# Patient Record
Sex: Male | Born: 2000 | Race: White | Marital: Single | Smoking: Never smoker
Health system: Northeastern US, Academic
[De-identification: ages and names within clinical notes are randomized; demographics above are authoritative.]

## PROBLEM LIST (undated history)

## (undated) DIAGNOSIS — Z9109 Other allergy status, other than to drugs and biological substances: Secondary | ICD-10-CM

## (undated) DIAGNOSIS — J302 Other seasonal allergic rhinitis: Secondary | ICD-10-CM

## (undated) DIAGNOSIS — I10 Essential (primary) hypertension: Secondary | ICD-10-CM

## (undated) DIAGNOSIS — F988 Other specified behavioral and emotional disorders with onset usually occurring in childhood and adolescence: Secondary | ICD-10-CM

## (undated) HISTORY — DX: Essential (primary) hypertension: I10

## (undated) HISTORY — DX: Other allergy status, other than to drugs and biological substances: Z91.09

---
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
## 2021-03-07 NOTE — Telephone Encounter (Signed)
Spoke with mom patient is scheduled on 03/06 at 10:00 with Dr. Hermelinda Medicus  Mom stated patient goes to college in [place] and will not be home until then

## 2021-03-27 ENCOUNTER — Ambulatory Visit: Payer: Commercial Managed Care - PPO | Attending: Sleep Medicine | Admitting: Sleep Medicine

## 2021-03-27 DIAGNOSIS — G4733 Obstructive sleep apnea (adult) (pediatric): Secondary | ICD-10-CM

## 2021-03-27 NOTE — Progress Notes (Signed)
[facility]: Video Visit New Patient Encounter       Referred to the [facility] by Doristine Locks, NP for a new patient evaluation.    Consent was previously obtained from the patient to complete this video telemedicine consultation, including the potential for financial liability.    Location of Patient: Home/[facility]  Location of Telemedicine Provider: Provider Office  Other participants in telemedicine encounter and roles: n/a    HISTORY OF THE PRESENT ILLNESS (HPI)       [name] is a 20 y.o. young man with a medical history significant for hypertension, attention deficit disorder (ADD), anxiety, and nephrolithiasis. Ronald Wise was referred to our service for evaluation for possible sleep disordered breathing. His presenting symptoms include difficulty initiating and maintaining sleep, snoring, witnessed pauses in breathing during sleep, bruxism, restless sleep, unrefreshing/nonrestorative sleep, morning xerostomia, morning sore throat, daytime sleepiness, fatigue, difficulty with concentration/focus and irritability.     SLEEP HISTORY:  Do you keep a regular sleep schedule?: (P) No  What time do you usually go to bed (please note am/pm)?  : (P) Between 11pm and 1am  How long does it take you to fall asleep?: (P) Around an hour and a half to two hours naturally and arond 30 minutes to an hour with melatonin.  Once asleep, how many times do you wake up? : (P) 3-4 times  What causes you to wake up?: (P) Can be a dreaming, temperature in room, uncomfortable, etc.  How long does it take you to fall back asleep?: (P) It depends on why I woke up, it can be a few seconds to even like 5-10 minutes. It varies.  What position(s) do you sleep in?: (P) Usually on my side or my stomach.  What time do you usually get up?: (P) Monday, Wednesday, Friday I wake up at 9am, Tuesday and Thursday 5:35am *for swim team practice  Do you wake up feeling rested?: (P) No  Do you feel tired  even after getting a full night's sleep?: (P) Yes  Do you nap?: (P) No  Are your naps refreshing?  : (P) Yes  Do you consume caffeine?: (P) Yes  What type(s) of caffeine do you consume (coffee, soda, pills, chocolate, other)?: (P) Soda, pre workout before lifting, or energy drink.  How many drinks/pills/servings per day?: (P) Usually 1-2 sodas a day, energy drinks are rare, and pre workout is around 2-3 times a week.   Do you consume for alertness, habit, or another reason?: (P) I consume these in order to wake me up in the morning if needed, or for alertness.   Snoring: (P) Yes  Witnessed gasping/choking/pauses in breathing: (P) Yes *observed by his sister during a recent family trip where they shared a hotel room, she told their mom who encouraged Ronald Wise to pursue a sleep study, also given his elevated blood pressure  Waking up gasping for air or feeling like you are choking: (P) No  Frequent nighttime urination: (P) No  Night sweats/hot flashes: (P) No  Grinding your teeth at night: (P) Yes  Nasal congestion/drainage: (P) Yes  Restlessness in legs before bed: (P) No  Frequent leg movements during sleep: (P) Yes  Morning dry mouth/sore throat: (P) Yes  Morning headache/head pain: (P) No  Daytime sleepiness: (P) Yes  Dozing while driving/motor vehicle accident related to dozing while driving: (P) No  Trouble with focus/concentration/attention: (P) No  Irritability: (P) Yes  Depression: (P) Yes  Anxiety: (P)  Yes   Unusual behaviors during sleep (choose all that apply)Unusual behaviors during sleep (choose all that apply): (P) Sleep talking       EPWORTH SLEEPINESS SCALE QUESTIONNAIRE     Recent Review Flowsheet Data     Epworth Sleep Scale 03/26/2021    Sitting and reading 1    Watching TV 1    Sitting inactive in a public place (e.g a theater or a meeting) 1    As a passenger in a car for an hour without a break 2    Lying down to rest in the afternoon when circumstances permit 2    Sitting and talking to someone  1    Sitting quietly after a lunch without alcohol 0    In a car, while stopped for a few minutes in traffic 0    EPWORTH TOTAL  8      Ronald Wise completed an Epworth Sleepiness Scale, with a score of 8/24, which is not suggestive of significant daytime sleepiness. He has at times experienced drowsiness while driving long distances, and knows to pull over immediately if this situation occurs. We discussed safe driving precautions. He does not generally take naps during the daytime hours. He takes lisdexamfetamine 40 mg daily for ADD, and occasionally methylphenidate IR 20 mg prn. He is on the swim team at school.        ACTIVE PROBLEM LIST     Patient Active Problem List   Diagnosis Code    Anxiety F41.9    ADHD (attention deficit hyperactivity disorder) F90.9    Distal radius fracture S52.509A    Stone in kidney N20.0    History of kidney stones Z87.442    Elevated blood pressure reading R03.0       PAST MEDICAL HISTORY     Past Medical History:   Diagnosis Date    ADD (attention deficit disorder)     Seasonal allergies        REVIEW OF SYSTEMS       Weight gain/lbs: (P) 0  Weight loss/lbs: (P) 20  Palpitations: (P) No  Swelling of feet/ankles: (P) No  Cough: (P) Yes  Wheeze: (P) No  Shortness of breath: (P) No  Heartburn/GERD: (P) No  Constipation: (P) No  Seizure: (P) No  Pain: (P) No   Pain interferes with sleep: (P) No       FAMILY HISTORY    Insomnia: (P) No  Narcolepsy: (P) No  Restless Leg Syndrome: (P) No  Sleep Apnea: (P) Yes  Relationship: (P) Uncle *Dad is a very loud snorer, paternal grandmother; snorers on mom's side as well      SOCIAL HISTORY     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  MEDICATIONS       Current Outpatient Medications   Medication Sig    methylphenidate (RITALIN) 20 mg tablet Take 20 mg by mouth daily    potassium citrate (UROCIT-K) 10 mEq (1080 mg) CR tablet Take 1 tablet (10 mEq total) by mouth daily  for Hypocitraturic Calcium Oxalate Kidney Stones    chlorthalidone (HYGROTEN) 25 mg tablet TAKE 1/2 TABLET(12.5 MG) BY MOUTH EVERY MORNING    lisdexamfetamine (VYVANSE) 40 MG capsule Take 40 mg by mouth every morning    fluticasone (FLONASE) 50 MCG/ACT nasal spray Spray 1 spray into nostril daily    EPINEPHrine 0.3 mg/0.3 mL auto-injector Inject 0.3 mLs (0.3 mg total) into the muscle as needed for Anaphylaxis    cetirizine (ZYRTEC) 10 MG tablet Take 10 mg by mouth daily     No current facility-administered medications for this visit.       VITALS   There were no vitals taken for this visit.  Height: 5'10"  Most recent recorded weight: 214 lbs, BMI ~30    PHYSICAL EXAM     Limited physical examination due to video visit.     Constitutional/General: Patient seated in chair, no acute distress noted   Eyes: Clear, no conjunctivitis noted  HEENT: No nasal discharge; modified Mallampati class II airway  Cardiovascular: Patient reported pulses in bilateral upper extremities  Respiratory: Non-labored breathing, no coughing observed  Gastrointestinal No visible abdominal distention    Musculoskeletal: Gait  Neurologic: Alert, attentive, and fully oriented.  Speech was full and fluent.  No aphasia.  Full eye movements.  Normal facial symmetry.  Tongue protrudes midline  Integumentary: No visible rashes    ASSESSMENT      Concern for Obstructive Sleep Apnea (OSA)     Seaborn Nakama is a 20 y.o. young man with a medical  history significant for hypertension, attention deficit disorder (ADD), anxiety, and nephrolithiasis. Ronald Wise was referred to our service for evaluation for possible sleep disordered breathing. His presenting symptoms include difficulty initiating and maintaining sleep, snoring, witnessed pauses in breathing during sleep by his sibling, bruxism, restless sleep, unrefreshing/nonrestorative sleep, morning xerostomia, morning sore throat, daytime sleepiness, fatigue, difficulty with concentration/focus and irritability. There is a strong family history of loud snoring as well as obstructive sleep apnea (OSA). In sum, Ronald Wise's clinical presentation raises concern for obstructive sleep apnea, particularly as OSA is a recognized risk factor for secondary hypertension.       RECOMMENDATIONS/PLAN       We talked about obstructive sleep apnea and its associated symptoms. We discussed the potential consequences of untreated OSA, depending on the severity of the sleep disordered breathing. We talked about safe driving. We also talked about how sleep apnea is diagnosed, and discussed common treatments for OSA.     Within the next couple of months, Ronald Wise will undergo a home sleep apnea test, and I will contact him via MyChart upon completion of the study to review the results and discuss treatment options, as appropriate.     Consent was previously obtained from the patient to complete this video telemedicine consultation, including the potential for financial liability. The plan was discussed with the patient and he demonstrated understanding to the provider's satisfaction. In my professional opinion, my resulting orders would not have differed if an in-person consultation had been performed.     This was a moderate complexity medical decision making encounter.    Nadara Eaton, MD  Associate Professor of Clinical Medicine  [facility]  Electronically signed by Nadara Eaton, MD on 03/27/2021 at 1:29 PM.

## 2021-04-21 NOTE — Progress Notes (Signed)
[facility]       HOME SLEEP STUDY POST TEST QUESTIONNAIRE      Patient Name:        Ronald Wise    MRN #  [id number]      1. What was your approximate bedtime? _________________________      2.  How long did it take you to fall asleep? _________________________      3. How many times did you wake up? _________________________      a. Approximately what time? / How long were you awake?    _____________/_______________    _____________/_______________    _____________/_______________    _____________/_______________    4. At what time did you get up for the day? __________________________    5. Any problems or comments?  __________________________               Patient signature:______________________   Date:_______________            Please Complete this page  &  Return with Sleep Study Case.Marland KitchenMarland KitchenThank You!                                                                                                      [facility]    HOME SLEEP TESTING EQUIPMENT AGREEMENT FORM    PATIENT NAME:  Ronald Wise     MRN #:    [id number]           PHONE NUMBER:   [phone]     ADDRESS:   [address]  [facility] [address]    I have received a Home Sleep Monitor (Alice NightOne) from the [facility] and have been instructed how to use it.    Serial Number:  [id number]    I have agreed to return this monitor to [facility]. on  ______________ between 7:30 AM and 3:00 PM.     Anyone can drop-off HSAT device for a patient   Return device to 3rd floor office If any drop-off changes call @ [phone]   To ensure patient health, please maintain social distancing rules during drop off.     Sleep Lab open until 4:30 pm Monday thru Friday    If you have any equipment questions during testing, please call our main lab at [phone].    This monitor is the property of [facility].  The value of this equipment is $2,495.  If you fail to return it by the date stated above, I understand  that the Hospitals legal authorities will be notified.  Failure to return this monitor is considered theft of hospital property,                                            READ THIS FIRST    Home Sleep Study Guide     For Home Sleep Study set-up instructions watch the following video: [email or web address]  Click the  link sent via a MyChart message or search the Internet for [employer].  Refer to the provided instructions as needed.     To initiate recording simply place the blue belt around your chest making sure both ends of the belt are fully connected to the device.  This belt action automatically starts and stops the apnea screening device. The On/Off quadrant light spins for 30 seconds.  The 3 sensor lights flash yellow until they recognize a good signal changing to solid green.  Once all 3 turn green the On/Off light also turns solid green for 1 minute, then they'll shut off for the night one by one.  The device does not need to be on your skin, can go over top of nightwear.     We suggest that you adequately tape the cannula and oximeter, and sleep in whatever position you prefer.  If you get up during the night you can leave the equipment on, feel free to remove the finger probe briefly while up, just put back on a finger when returning to bed.       In the morning, unplug one side of the belt to stop the study and discard nasal cannula before placing equipment back in the box.  All other equipment can remain attached to the unit.       Return the unit to the lab the next business day between preferred hours of 7:30 am and 3:00 pm.      If unable to return the device timely please call us @ [phone]     Results will be communicated via telemedicine or My Chart.      If you or someone in your home develop COVID-19 symptoms within 24 hours of the study please call the lab at [phone].                                                           [facility]    HOME SLEEP TESTING EQUIPMENT AGREEMENT FORM    PATIENT NAME:  Ronald Wise     MRN #:    [id number]           PHONE NUMBER:   [phone]     ADDRESS:   [address]  [facility] [address]    I have received a Home Sleep Monitor (Alice NightOne) from the [facility] and have been instructed how to use it.    Serial Number:  [id number]    I have agreed to return this monitor to [facility] on  ______________ between 7:30 AM and 3:00 PM.     Anyone can drop-off HSAT device for a patient   Return device to 3rd floor office If any drop-off changes call @ [phone]   To ensure patient health, please maintain social distancing rules during drop off.     Sleep Lab open until 4:30 pm Monday thru Friday    If you have any equipment questions during testing, please call our main lab at [phone].    This monitor is the property of [facility].  The value of this equipment is $2,495.  If you fail to return it by the date stated above, I understand that the Hospitals legal authorities will be notified.  Failure to return this monitor is considered theft of hospital property.      Signed:  ________________________   Date: _________      Witness:  _______________________   Date: _________

## 2021-04-23 ENCOUNTER — Ambulatory Visit: Payer: Commercial Managed Care - PPO | Attending: Sleep Medicine | Admitting: Sleep Medicine

## 2021-04-23 ENCOUNTER — Other Ambulatory Visit: Payer: Self-pay

## 2021-04-23 DIAGNOSIS — G4733 Obstructive sleep apnea (adult) (pediatric): Secondary | ICD-10-CM | POA: Insufficient documentation

## 2021-04-23 DIAGNOSIS — R0683 Snoring: Secondary | ICD-10-CM | POA: Insufficient documentation

## 2021-04-23 NOTE — Progress Notes (Signed)
ARRIVED FOR HSAT PICK UP    Sleep MD:  CZM    Interpreter Used: No    Estimated return date:  04/24/21     Ronald Wise on 04/23/2021 at 8:32 AM

## 2021-04-25 NOTE — Progress Notes (Signed)
RETURNED HSAT DEVICE    Data quality: adequate, analysis in process    Follow up date: Visit date not found     Ronald Wise on 04/25/2021 at 1:33 PM

## 2021-04-26 NOTE — Procedures (Addendum)
Home Sleep Apnea Test Report    This is a report for a nocturnal, unattended home sleep apnea test completed by the [facility] on April 23, 2021.    This study utilized a 6-channel Type III home sleep respiratory recorder.  The following parameters were monitored for a duration of 553.5 minutes: snoring (high frequency vibrations in airflow), oronasal pressure airflow, RIP chest effort, SpO2, pulse rate, and body position.  There was no EEG monitoring during this study, as such we were not able to discern between periods of sleep and wake. As such, home sleep tests tend to underestimate the actual severity of sleep disordered breathing. The data obtained during this home sleep apnea test was generally of good quality.    This is a normal home sleep apnea test. The study is not suggestive of a diagnosis of obstructive sleep apnea (OSA). The patient's unattended apnea hypopnea index (uAHI) was 1.3 respiratory events per hour of recording time. His oxyhemoglobin saturation was normal throughout the night.  Soft snoring was noted occasionally.     Of note, the above table labeled, "Heart Rate Stats," contains spurious data. The patient's heart rate generally ranged in the 50s-80s bpm. He did not have bradycardia or tachycardia as suggested in the table above (incorrect/spurious data).    This study was electronically signed by Nadara Eaton, MD, interpreting physician, on 04/29/2021 at 8:15 AM.

## 2021-06-22 ENCOUNTER — Other Ambulatory Visit: Payer: Self-pay | Admitting: Pediatric Nephrology

## 2021-06-22 DIAGNOSIS — N200 Calculus of kidney: Secondary | ICD-10-CM

## 2021-06-23 MED ORDER — POTASSIUM CITRATE 1080 MG PO TBCR *I*
10.0000 meq | ORAL_TABLET | Freq: Every day | ORAL | 2 refills | Status: DC
Start: 2021-06-23 — End: 2021-10-07

## 2021-06-23 NOTE — Telephone Encounter (Signed)
Attempted to call pt to verify pharmacy.   No answer, left detailed message on identified voicemail requesting call back to confirm.

## 2021-06-30 ENCOUNTER — Ambulatory Visit: Payer: Commercial Managed Care - PPO | Admitting: Pediatric Nephrology

## 2021-09-01 ENCOUNTER — Other Ambulatory Visit: Payer: Self-pay

## 2021-09-01 ENCOUNTER — Encounter: Payer: Self-pay | Admitting: Pediatric Nephrology

## 2021-09-01 ENCOUNTER — Ambulatory Visit: Payer: Commercial Managed Care - PPO | Attending: Pediatric Nephrology | Admitting: Pediatric Nephrology

## 2021-09-01 VITALS — BP 150/100 | HR 106 | Temp 98.1°F | Ht 70.7 in | Wt 198.1 lb

## 2021-09-01 DIAGNOSIS — Z1389 Encounter for screening for other disorder: Secondary | ICD-10-CM | POA: Insufficient documentation

## 2021-09-01 DIAGNOSIS — N200 Calculus of kidney: Secondary | ICD-10-CM | POA: Insufficient documentation

## 2021-09-01 DIAGNOSIS — R030 Elevated blood-pressure reading, without diagnosis of hypertension: Secondary | ICD-10-CM | POA: Insufficient documentation

## 2021-09-01 DIAGNOSIS — Z87442 Personal history of urinary calculi: Secondary | ICD-10-CM | POA: Insufficient documentation

## 2021-09-01 LAB — POCT URINE MICROSCOPY

## 2021-09-01 LAB — POCT URINALYSIS DIPSTICK
Glucose,UA POCT: NORMAL mg/dL
Ketones,UA POCT: NEGATIVE mg/dL
Leuk Esterase,UA POCT: NEGATIVE
Lot #: 64120401
Nitrite,UA POCT: NEGATIVE
PH,UA POCT: 5 (ref 5–8)

## 2021-09-01 LAB — CALCIUM, URINE: Calcium,Ur: 9.1 mg/dL

## 2021-09-01 LAB — CALCIUM,UR + CREATININE,UR RATIO: Ca/creatinine ratio,Ur: 0.09

## 2021-09-01 LAB — CREATININE, URINE: Creatinine,UR: 107 mg/dL (ref 20–300)

## 2021-09-01 LAB — POCT REFRACTOMETER SPECIFIC GRAVITY: Specific gravity: 1.011 (ref 1.002–1.030)

## 2021-09-01 NOTE — Progress Notes (Unsigned)
Subjective:      Chief Complaint   Patient presents with   . Nephrolithiasis     Hx of passing 3 stones   . Hypertension     Consistent elevated bps at clinic       History of Present Illness:Ronald Wise is a 21 y.o. male who is seen for follow up for history of passing 3 kidney stones, most recently in Jun 2021; he also has had elevated blood pressures.Ronald Wise is here today without a parent.  BP ran high today  BP during the school year was relatively NL, but he does not remember what they were. One BP from Sep 2022 was 139/77.  He did not do much physical activity due to studying so BP increased later in the school year.  He is now more active and believes his BP should improve.  Unfortunately no BPs were entered in flowsheet since 2021.  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  He may have passed a kidney stone 2 weeks ago after playing frisbee golf; he saw blood in the urine and this cleared after taking fluids; he did not feel the passage of any kidney stone.  He was not drinking fluids much before but in college is keeping up with his drinking (60 oz per day).  There was no dysuria.  He denies swelling of face of legs.  He was not taking his chlorthalidone during much of the school year.  He had his sleep study which was negative for sleep apnea.    During the ABPM in Jun 2022 Ronald Wise woke up to find the monitor had migrated down his arm; he replaced it. Waking up may have raised his BP.      Patient's medications, allergies, past medical, surgical, social and family histories were reviewed and updated as needed.     Problem List:  Patient Active Problem List    Diagnosis Date Noted   . Elevated blood pressure reading [R03.0] 09/05/2020   . History of kidney stones [Z87.442] 09/01/2018   . Stone in kidney [N20.0] 03/02/2018   . Distal radius fracture [S52.509A] 12/28/2014   . Anxiety [F41.9] 02/17/2013   . ADHD (attention deficit hyperactivity disorder) [F90.9] 02/17/2013        Allergies:  Ketorolac, Environmental allergies, Penicillins, No known latex allergy, and Meningococcal group b vaccine    Current Medications:    Current Outpatient Medications:   .  potassium citrate (UROCIT-K) 10 mEq (1080 mg) CR tablet, Take 1 tablet (10 mEq total) by mouth daily  for Hypocitraturic Calcium Oxalate Kidney Stones, Disp: 90 tablet, Rfl: 2  .  methylphenidate (RITALIN) 20 mg tablet, Take 20 mg by mouth daily, Disp: , Rfl:   .  chlorthalidone (HYGROTEN) 25 mg tablet, TAKE 1/2 TABLET(12.5 MG) BY MOUTH EVERY MORNING, Disp: 45 tablet, Rfl: 2  .  lisdexamfetamine (VYVANSE) 40 MG capsule, Take 40 mg by mouth every morning, Disp: , Rfl:   .  cetirizine (ZYRTEC) 10 MG tablet, Take 10 mg by mouth daily, Disp: , Rfl:     Review of Systems  Review of Systems   Constitutional: Negative for fever.   HENT: Negative for congestion, facial swelling, nosebleeds, rhinorrhea and sore throat.    Eyes: Negative for visual disturbance.   Respiratory: Negative for cough and shortness of breath.    Cardiovascular: Negative for chest pain and leg swelling.   Gastrointestinal: Negative for abdominal pain, constipation, diarrhea, nausea and vomiting.   Genitourinary: Negative  for dysuria, flank pain, frequency and hematuria.   Musculoskeletal: Negative for arthralgias and joint swelling.   Skin: Negative for rash.   Neurological: Negative for headaches.   Hematological: Does not bruise/bleed easily.   Psychiatric/Behavioral: Negative for behavioral problems, dysphoric mood and sleep disturbance.           Objective:    Vitals:   Vitals:    09/01/21 0925 09/01/21 1057   BP: (!) 155/96 (!) 150/100   BP Location:  Right arm   Patient Position:  Sitting   Cuff Size:  adult   Pulse: 106    Temp: 36.7 C (98.1 F)    TempSrc: Temporal    Weight: 89.9 kg (198 lb 1.6 oz)    Height: 1.796 m (5' 10.7")      65 %ile (Z= 0.39) based on CDC (Boys, 2-20 Years) Stature-for-age data based on Stature recorded on 09/01/2021. 91 %ile (Z=  1.37) based on CDC (Boys, 2-20 Years) weight-for-age data using vitals from 09/01/2021.  Body mass index is 27.86 kg/m. 89 %ile (Z= 1.24) based on CDC (Boys, 2-20 Years) BMI-for-age based on BMI available as of 09/01/2021.        11/18/2020  1003 11/18/2020  1004 11/18/2020  1038 09/01/2021  0925    BP: 148/94 150/89 140/88 155/96    BP Location: -- -- Right arm --        Blood pressure %iles are not available for patients who are 18 years or older.     Physical Exam:  Physical Exam  Vitals reviewed.   Constitutional:       General: He is not in acute distress.     Appearance: Normal appearance. He is well-developed.   HENT:      Head: Normocephalic and atraumatic.      Right Ear: External ear normal.      Left Ear: External ear normal.      Nose: Nose normal.      Mouth/Throat:      Mouth: Mucous membranes are moist.      Pharynx: No posterior oropharyngeal erythema.      Comments: Tonsils not enlarged  Eyes:      General: No scleral icterus.        Right eye: No discharge.         Left eye: No discharge.      Conjunctiva/sclera: Conjunctivae normal.   Neck:      Thyroid: No thyromegaly.   Cardiovascular:      Rate and Rhythm: Normal rate and regular rhythm.      Pulses: Normal pulses.      Heart sounds: Normal heart sounds. No murmur heard.     No friction rub. No gallop.   Pulmonary:      Effort: Pulmonary effort is normal. No respiratory distress.      Breath sounds: Normal breath sounds. No wheezing or rales.   Abdominal:      General: Abdomen is flat. There is no distension.      Palpations: Abdomen is soft.      Tenderness: There is no abdominal tenderness. There is no right CVA tenderness, left CVA tenderness or guarding.   Genitourinary:     Comments: Bladder non-tender and normal to palpation  Musculoskeletal:         General: No tenderness.      Right lower leg: No edema.      Left lower leg: No edema.   Lymphadenopathy:  Cervical: No cervical adenopathy.   Skin:     General: Skin is warm and dry.       Coloration: Skin is not pale.      Findings: No rash.   Neurological:      General: No focal deficit present.      Mental Status: He is alert and oriented to person, place, and time.   Psychiatric:         Mood and Affect: Mood normal.         Behavior: Behavior normal.         Laboratory:   MICROSCOPIC URINALYSIS:   (Performed by Provider)    LATEST LABORATORY RESULTS:    Recent Results (from the past 72 hour(s))   POCT urine microscopy    Collection Time: 09/01/21  9:30 AM   Result Value Ref Range    Squamous epithelial cells, UA POCT      Transitional epithelial cells, UA POCT 2-4 0 - 1 /hpf    Renal epithelial cells, UA POCT      Mucous, UA POCT      RBC, UA POCT 11-50 0 - 2 /hpf    WBC, UA POCT 0-5 0 - 5 /hpf    WBC clumps, UA POCT 0-1 None /hpf    Oval fat bodies, UA POCT      Yeast, UA POCT      Bacteria, UA POCT 0-5 None /hpf    Trichomonas, UA POCT      Crystals, UA POCT Present Not Present    Crystals comment/type Ca oxalate 2-10/hpf     Casts, UA POCT      Cast comment/type none    POCT urinalysis dipstick    Collection Time: 09/01/21  9:36 AM   Result Value Ref Range    Specific gravity,UA POCT Test Not Performed 1.002 - 1.030    PH,UA POCT 5.0 5 - 8    Leuk Esterase,UA POCT Negative Negative    Nitrite,UA POCT Negative Negative    Protein,UA POCT Trace (!) Negative mg/dL    Glucose,UA POCT Normal Normal mg/dL    Ketones,UA POCT Negative Negative mg/dL    Urobilinogen,UA      Bilirubin,Ur      Blood,UA POCT About 250 (!) Negative    Exp date 05/27/2022     Lot # [id number]    POCT refractometer specific gravity    Collection Time: 09/01/21  9:38 AM   Result Value Ref Range    Specific gravity 1.011 1.002 - 1.030      Latest Reference Range & Units 09/01/21 10:44 09/01/21 18:00   Collection Period hrs RANDOM    Total Volume,UR mL RANDOM    Calcium,Ur mg/dL 9.1    Creatinine,UR 20 - 300 mg/dL 540    Ca/creatinine ratio,Ur   0.09           Assessment:    Ronald Wise is a 21 y.o. who was seen in follow up of at  least 3kidney stones with a strong family history of kidney stones. His most recent stone was likely passed in the Spring of 2023 at college; he did not take his chlorthalidone much during the school year..  Urine Ca/Cr ais NL at 0.09; citrate is pending.  BPshave been high,in the 130s-140s systolic and 80s-90s diastolic, and even higher in clinic today. I am asking for some home BPs to document whether this is a consistent finding or more of a white coat phenomenon. Marland KitchenMarland Kitchen  Plan:      Urine sent for Ca, Cr, citrate, and oxalate.   Continue to drink 60 oz fluids per day.  Check BP twice a day for next two weeks; log in to flowsheet on Mychart  Please record BPs in the flowsheet I sent you through mychart. To do this just follow these instructions:    Log in to MyChart.   Put the cursor over one of the tabs on the site, titled "My Medical Record"   A tab shows up directly below it and on the right side, under "medical tools" click on "Track My Health"   It leads to another page   Below where it shows "Active Flowsheets" , Click on the Blood Pressure Flowsheet   Then it leads to a page where data can be added.     To add the Data :   Click "Add New Data"   Add data and click continue   A review page will show up, and click "submit"   Finally, it leads back to a page where the data submitted are shown    Look for Internal Medicine or Family Medicine PCP  Refer to Adult Nephrolgy-?R Shawnee Knapp  Thank you for the opportunity to share in [place] Reindel's care. We have asked that Sing Hise return to the pediatric nephrology clinic within 6 weeks. In the interval, please do not hesitate to call with new developments or if you have additional questions or concerns.     Plan of care, including education on the safe and effective use of medication(s) and/or medical equipment if prescribed, was discussed with the patient/family. Patient/family verbalized understanding and agreed with the treatment options discussed.       Vida Roller, MD

## 2021-09-01 NOTE — Patient Instructions (Signed)
Urine sent for Ca, Cr, citrate, and oxalate.   Check BP twice a day for next two weeks; log in to flowsheet on Mychart  Please record BPs in the flowsheet I sent you through mychart. To do this just follow these instructions:    Log in to MyChart.   Put the cursor over one of the tabs on the site, titled "My Medical Record"   A tab shows up directly below it and on the right side, under "medical tools" click on "Track My Health"   It leads to another page   Below where it shows "Active Flowsheets" , Click on the Blood Pressure Flowsheet   Then it leads to a page where data can be added.     To add the Data :   Click "Add New Data"   Add data and click continue   A review page will show up, and click "submit"   Finally, it leads back to a page where the data submitted are shown    Look for Internal Medicine or Family Medicine PCP  Refer to Adult Nephrolgy-?R Shawnee Knapp  Thank you for the opportunity to share in [place]'s care. We have asked that Fernando Poole return to the pediatric nephrology clinic within 6 weeks. In the interval, please do not hesitate to call with new developments or if you have additional questions or concerns.

## 2021-09-02 NOTE — Progress Notes (Signed)
Spoke with mom schedule appt for 10/27/21 at 8:30am

## 2021-09-08 LAB — OXALATE, URINE
Creat,Ur: 110 mg/dL
Oxalates, Ur: 21 mg/L

## 2021-09-08 LAB — CITRATE, URINE
Citric Acid, Ur: 124 mg/L
Citric/Creat: 113 mg/g — ABNORMAL LOW (ref >=150)

## 2021-10-07 ENCOUNTER — Other Ambulatory Visit: Payer: Self-pay | Admitting: Pediatric Nephrology

## 2021-10-07 ENCOUNTER — Encounter: Payer: Self-pay | Admitting: Pediatric Nephrology

## 2021-10-07 DIAGNOSIS — N200 Calculus of kidney: Secondary | ICD-10-CM

## 2021-10-07 DIAGNOSIS — Z87442 Personal history of urinary calculi: Secondary | ICD-10-CM

## 2021-10-07 NOTE — Telephone Encounter (Addendum)
Paged Dr. Hermelinda Medicus to see if stone should be dropped at the lab/confirm order needed.  ---------------------------------------------  (ADDENDUM) Returned missed call from Dr. Hermelinda Medicus 10/08/19 1204:    - Provider will order stone analysis and pt can drop stone off.     Would like to have answers to the following:  1. How much is he drinking?  2. Taking K-Citrate still? Missed doses? What dose is he taking?  3. Taking Chlorthalidone still? Missed doses? What dose is he taking?    Writer sent MyChart message with the above.

## 2021-10-08 ENCOUNTER — Other Ambulatory Visit
Admission: RE | Admit: 2021-10-08 | Discharge: 2021-10-08 | Disposition: A | Discharge status: Home / Self Care | Payer: Commercial Managed Care - PPO | Source: Ambulatory Visit | Attending: Pediatric Nephrology | Admitting: Pediatric Nephrology

## 2021-10-08 ENCOUNTER — Other Ambulatory Visit: Payer: Self-pay | Admitting: Nephrology

## 2021-10-08 ENCOUNTER — Telehealth: Payer: Self-pay

## 2021-10-08 DIAGNOSIS — N200 Calculus of kidney: Secondary | ICD-10-CM | POA: Insufficient documentation

## 2021-10-08 DIAGNOSIS — Z87442 Personal history of urinary calculi: Secondary | ICD-10-CM | POA: Insufficient documentation

## 2021-10-08 DIAGNOSIS — N189 Chronic kidney disease, unspecified: Secondary | ICD-10-CM

## 2021-10-08 MED ORDER — POTASSIUM CITRATE 1080 MG PO TBCR *I*
10.0000 meq | ORAL_TABLET | Freq: Every day | ORAL | 2 refills | Status: DC
Start: 2021-10-08 — End: 2021-10-27

## 2021-10-08 NOTE — Telephone Encounter (Signed)
Giovan Aronoff has been scheduled for a new patient visit in nephrology clinic. New patient lab orders have been placed today: 10/08/2021.

## 2021-10-08 NOTE — Telephone Encounter (Signed)
Called and spoke to mother. Scheduled appointment for 02/12/22 at 4:00pm with Dr. Shelda Altes. Had to schedule with Dr. Shelda Altes due to he goes to college in [place], so had to schedule around his school breaks.     Explained to patients mother that labs will need to be done before the appointment     Sent message to nurse to have labs added to chart

## 2021-10-10 ENCOUNTER — Other Ambulatory Visit: Payer: Self-pay

## 2021-10-10 ENCOUNTER — Other Ambulatory Visit
Admission: RE | Admit: 2021-10-10 | Discharge: 2021-10-10 | Disposition: A | Discharge status: Home / Self Care | Payer: Commercial Managed Care - PPO | Source: Ambulatory Visit | Attending: Pediatric Nephrology | Admitting: Pediatric Nephrology

## 2021-10-10 DIAGNOSIS — N200 Calculus of kidney: Secondary | ICD-10-CM | POA: Insufficient documentation

## 2021-10-10 LAB — URINALYSIS WITH MICROSCOPIC
Bacteria,UA: NONE SEEN
Blood,UA: NEGATIVE
Glucose,UA: NEGATIVE
Hyaline Casts,UA: NONE SEEN /lpf (ref 0–5)
Ketones, UA: NEGATIVE
Leuk Esterase,UA: NEGATIVE
Nitrite,UA: NEGATIVE
Protein,UA: NEGATIVE
Specific Gravity,UA: 1.006 (ref 1.002–1.030)
Squam Epithel,UA: NONE SEEN /lpf (ref 0–?)
WBC,UA: NONE SEEN /hpf (ref 0–5)
pH,UA: 6.5 (ref 5.0–8.0)

## 2021-10-10 LAB — MICROALBUMIN, URINE, RANDOM
Creatinine,UR: 26 mg/dL (ref 20–300)
Microalbumin,UR: <1.2 mg/dL

## 2021-10-10 LAB — CALCIUM,UR + CREATININE,UR RATIO: Ca/creatinine ratio,Ur: 0.05

## 2021-10-10 LAB — CALCIUM, URINE: Calcium,Ur: 1.4 mg/dL

## 2021-10-10 NOTE — Telephone Encounter (Addendum)
Labs ordered:   -UA with Micro  -Calcium Urine  -Oxalate Urine  -Creatinine Urine  - Citrate Urine

## 2021-10-10 NOTE — Addendum Note (Signed)
Addended by: Laurin Coder R on: 10/10/2021 11:16 AM     Modules accepted: Orders

## 2021-10-11 LAB — STONE ANALYSIS: Calculi Mass: 26 mg

## 2021-10-14 LAB — CITRATE, URINE
Citric Acid, Ur: 28 mg/L
Citric/Creat: 104 mg/g — ABNORMAL LOW (ref >=150)

## 2021-10-17 LAB — OXALATE, URINE
Creat,Ur: 27 mg/dL
Oxalates, Ur: 5 mg/L

## 2021-10-20 ENCOUNTER — Encounter: Payer: Self-pay | Admitting: Pediatric Nephrology

## 2021-10-20 NOTE — Telephone Encounter (Signed)
Stone analysis: calcium oxalate  Urine citrate also low on random sample.     Currently taking chlorthalidone 12.5mg  daily and urocit-K daily.     FUV 10/27/21 with Dr. Hermelinda Medicus. Will discuss results and medication adjustments at visit.

## 2021-10-27 ENCOUNTER — Ambulatory Visit: Payer: Commercial Managed Care - PPO | Attending: Pediatric Nephrology | Admitting: Pediatric Nephrology

## 2021-10-27 ENCOUNTER — Other Ambulatory Visit: Payer: Self-pay

## 2021-10-27 VITALS — BP 162/87 | HR 144 | Temp 97.9°F | Ht 70.67 in | Wt 202.3 lb

## 2021-10-27 DIAGNOSIS — Z87442 Personal history of urinary calculi: Secondary | ICD-10-CM | POA: Insufficient documentation

## 2021-10-27 DIAGNOSIS — N200 Calculus of kidney: Secondary | ICD-10-CM | POA: Insufficient documentation

## 2021-10-27 DIAGNOSIS — R030 Elevated blood-pressure reading, without diagnosis of hypertension: Secondary | ICD-10-CM | POA: Insufficient documentation

## 2021-10-27 LAB — POCT URINALYSIS DIPSTICK
Bilirubin,Ur: NEGATIVE
Blood,UA POCT: NEGATIVE
Glucose,UA POCT: NORMAL mg/dL
Ketones,UA POCT: NEGATIVE mg/dL
Lot #: 65249301
Nitrite,UA POCT: NEGATIVE
PH,UA POCT: 5 (ref 5–8)
Protein,UA POCT: NEGATIVE mg/dL
Specific gravity,UA POCT: 1.02 (ref 1.002–1.030)
Urobilinogen,UA: NORMAL mg/dL

## 2021-10-27 LAB — POCT URINE MICROSCOPY

## 2021-10-27 LAB — CREATININE, URINE: Creatinine,UR: 129 mg/dL (ref 20–300)

## 2021-10-27 MED ORDER — POTASSIUM CITRATE 1080 MG PO TBCR *I*
10.0000 meq | ORAL_TABLET | Freq: Two times a day (BID) | ORAL | 2 refills | Status: DC
Start: 2021-10-27 — End: 2022-04-16

## 2021-10-27 NOTE — Progress Notes (Unsigned)
Subjective:      Chief Complaint   Patient presents with   . Nephrolithiasis     Hx of passing 4 stones   . Elevated Blood Pressure     In clinic       History of Present Illness:Ronald Wise is a 21 y.o. male who is seen for follow up for a history of passing 4 kidney stones, most recently one in Jun 2021; he also has had elevated blood pressures in clinic. Ronald Wise is here today without a parent.  Ronald Wise has been well except for the passing of a kidney stone in June.   He is drinking lots of water (3L per day) and staying away from sodium.  He is being active.  He denies blood in the urine except a week prior to the passage of stone. He denies dysuria or flank pain.  He has been checking his BP, and they were usually in the normal range.   09/14/2021 09/14/2021 09/13/2021 09/13/2021 09/12/2021   Time 10:40 PM 8:55 AM 10:35 PM 9:30 AM 10:55 PM   Systolic 118 122 161 121 120   Diastolic 62 66 70 69 50   Heart rate 72 79 77 80 77      09/12/2021 09/11/2021 09/11/2021 09/09/2021 09/09/2021   Time 10:40 AM 10:43 PM 10:33 AM 9:30 PM 10:30 AM   Systolic 128 130 096 117 119   Diastolic 55 72 60 76 67   Heart rate 91 80 76 72 68      09/08/2021 09/08/2021 09/07/2021 09/07/2021 09/06/2021   Time 8:20 PM 9:45 AM 9:15 PM 10:30 AM 10:30 PM   Systolic 120 137 045 111 112   Diastolic 74 84 60 62 61   Heart rate 82 99 69 71 65     ABPM reading from Jun 2022 diagnosed ambulatory normotension during the day and diastolic hypertension while asleep.    He goes back to school in August.  Patient's medications, allergies, past medical, surgical, social and family histories were reviewed and updated as needed.     Problem List:  Patient Active Problem List    Diagnosis Date Noted   . Elevated blood pressure reading [R03.0] 09/05/2020   . History of kidney stones [Z87.442] 09/01/2018   . Stone in kidney [N20.0] 03/02/2018   . Distal radius fracture [S52.509A] 12/28/2014   . Anxiety [F41.9] 02/17/2013   . ADHD (attention deficit hyperactivity disorder)  [F90.9] 02/17/2013       Allergies:  Ketorolac, Environmental allergies, Penicillins, No known latex allergy, and Meningococcal group b vaccine    Current Medications:    Current Outpatient Medications:   .  potassium citrate (UROCIT-K) 10 mEq (1080 mg) CR tablet, Take 1 tablet (10 mEq total) by mouth daily  for Hypocitraturic Calcium Oxalate Kidney Stones, Disp: 90 tablet, Rfl: 2  .  methylphenidate (RITALIN) 20 mg tablet, Take 1 tablet (20 mg total) by mouth daily, Disp: , Rfl:   .  chlorthalidone (HYGROTEN) 25 mg tablet, TAKE 1/2 TABLET(12.5 MG) BY MOUTH EVERY MORNING, Disp: 45 tablet, Rfl: 2  .  lisdexamfetamine (VYVANSE) 40 MG capsule, Take 1 capsule (40 mg total) by mouth every morning  for Attention Deficit Hyperactivity Disorder, Disp: , Rfl:   .  cetirizine (ZYRTEC) 10 MG tablet, Take 1 tablet (10 mg total) by mouth daily, Disp: , Rfl:     Review of Systems  Review of Systems   Constitutional: Negative for fever.   HENT: Negative for congestion, facial swelling, nosebleeds,  rhinorrhea and sore throat.    Eyes: Negative for visual disturbance.   Respiratory: Negative for cough and shortness of breath.    Cardiovascular: Negative for chest pain and leg swelling.   Gastrointestinal: Negative for abdominal pain, constipation, diarrhea, nausea and vomiting.   Genitourinary: Negative for dysuria, flank pain, frequency and hematuria.   Musculoskeletal: Negative for arthralgias and joint swelling.   Skin: Negative for rash.   Neurological: Negative for headaches.   Hematological: Does not bruise/bleed easily.   Psychiatric/Behavioral: Negative for behavioral problems, dysphoric mood and sleep disturbance.           Objective:    Vitals:   Vitals:    10/27/21 0834   BP: 162/87   Pulse: (!) 144   Temp: 36.6 C (97.9 F)   TempSrc: Temporal   Weight: 91.8 kg (202 lb 4.8 oz)   Height: 1.795 m (5' 10.67")     65 %ile (Z= 0.38) based on CDC (Boys, 2-20 Years) Stature-for-age data based on Stature recorded on 10/27/2021. 93  %ile (Z= 1.46) based on CDC (Boys, 2-20 Years) weight-for-age data using vitals from 10/27/2021.  Body mass index is 28.48 kg/m. 91 %ile (Z= 1.33) based on CDC (Boys, 2-20 Years) BMI-for-age based on BMI available as of 10/27/2021.        10/27/2021  0834             BP: 162/87        Blood pressure %iles are not available for patients who are 18 years or older.     Physical Exam:  Physical Exam  Vitals reviewed.   Constitutional:       General: He is not in acute distress.     Appearance: Normal appearance. He is well-developed.   HENT:      Head: Normocephalic and atraumatic.      Right Ear: External ear normal.      Left Ear: External ear normal.      Nose: Nose normal.      Mouth/Throat:      Mouth: Mucous membranes are moist.      Pharynx: No posterior oropharyngeal erythema.      Comments: Tonsils not enlarged  Eyes:      General: No scleral icterus.        Right eye: No discharge.         Left eye: No discharge.      Conjunctiva/sclera: Conjunctivae normal.   Neck:      Thyroid: No thyromegaly.   Cardiovascular:      Rate and Rhythm: Normal rate and regular rhythm.      Pulses: Normal pulses.      Heart sounds: Normal heart sounds. No murmur heard.     No friction rub. No gallop.   Pulmonary:      Effort: Pulmonary effort is normal. No respiratory distress.      Breath sounds: Normal breath sounds. No wheezing or rales.   Abdominal:      General: Abdomen is flat. There is no distension.      Palpations: Abdomen is soft.      Tenderness: There is no abdominal tenderness. There is no right CVA tenderness, left CVA tenderness or guarding.   Genitourinary:     Comments: Bladder non-tender and normal to palpation  Musculoskeletal:         General: No tenderness.      Right lower leg: No edema.      Left lower leg: No edema.  Lymphadenopathy:      Cervical: No cervical adenopathy.   Skin:     General: Skin is warm and dry.      Coloration: Skin is not pale.      Findings: No rash.   Neurological:      General: No focal  deficit present.      Mental Status: He is alert and oriented to person, place, and time.   Psychiatric:         Mood and Affect: Mood normal.         Behavior: Behavior normal.         Laboratory:   MICROSCOPIC URINALYSIS:   (Performed by Provider)    LATEST LABORATORY RESULTS:    Recent Results (from the past 72 hour(s))   POCT urinalysis dipstick    Collection Time: 10/27/21 12:00 AM   Result Value Ref Range    Specific gravity,UA POCT 1.020 1.002 - 1.030    PH,UA POCT 5.0 5 - 8    Leuk Esterase,UA POCT Trace (!) Negative    Nitrite,UA POCT Negative Negative    Protein,UA POCT Negative Negative mg/dL    Glucose,UA POCT Normal Normal mg/dL    Ketones,UA POCT Negative Negative mg/dL    Urobilinogen,UA Normal Less than 1 mg/dL    Bilirubin,Ur Negative Negative, Test Not Performed    Blood,UA POCT Negative Negative    Exp date 06/25/22     Lot # [id number]    POCT urine microscopy    Collection Time: 10/27/21  8:30 AM   Result Value Ref Range    Squamous epithelial cells, UA POCT      Transitional epithelial cells, UA POCT      Renal epithelial cells, UA POCT      Mucous, UA POCT      RBC, UA POCT 0-2 0 - 2 /hpf    WBC, UA POCT 0-5 0 - 5 /hpf    WBC clumps, UA POCT      Oval fat bodies, UA POCT      Yeast, UA POCT      Bacteria, UA POCT 0-5 None /hpf    Trichomonas, UA POCT      Crystals, UA POCT Not Present Not Present    Crystals comment/type      Casts, UA POCT      Cast comment/type none    Calcium, urine    Collection Time: 10/27/21  9:18 AM   Result Value Ref Range    Calcium,Ur 15.1 mg/dL   Creatinine, urine    Collection Time: 10/27/21  9:18 AM   Result Value Ref Range    Creatinine,UR 129 20 - 300 mg/dL   Citrate, urine    Collection Time: 10/27/21  9:18 AM   Result Value Ref Range    Collection Period RANDOM hrs    Total Volume,UR RANDOM mL   Calcium,Ur + Creatinine,Ur ratio    Collection Time: 10/27/21  4:23 PM   Result Value Ref Range    Ca/creatinine ratio,Ur 0.12              Assessment:    Ronald Wise is a  21 y.o. who was seen in follow up of at least 4kidney stones with a strong family history of kidney stones. His most recent stone was likely passed in June 2023 at college; he did not take his chlorthalidone much during the school year.   The urine dip and micro are benign today. Urine citrates/creatinines have been consistently low  and so I am raising the dose of K citrate to bid dosing..  Urine Ca/Crwas NL at 0.12; citrate ispending.  BPswas again high in clinic today but most home BPs are in the normal range, compatible with white coat hypertension. To be sure about the status, it would like require a a repeat ABPM in the future.       Plan:      Consider repeating ABPM in adult Nephrology during the next year or two.  Continue on >3L fluids daily, and half tab chlorthalidone daily.  Urine sent for Ca/Cr and citrate.  Increase K citrate to 10 mEq twice daily. Script adjusted.  Obtain urine before morning dose and take to lab.  Amb Referral to [facility].  Check BP once a month for regular monitoring.    Thank you for the opportunity to share in Ronald Wise's care. We have asked that Ronald Wise return to the Adult Nephrology clinic within 6 months. In the interval, please do not hesitate to call with new developments or if you have additional questions or concerns.     Plan of care, including education on the safe and effective use of medication(s) and/or medical equipment if prescribed, was discussed with the patient/family. Patient/family verbalized understanding and agreed with the treatment options discussed.      Vida Roller, MD

## 2021-10-27 NOTE — Patient Instructions (Addendum)
Consider repeating ABPM in adult Nephrology during the next year or two.  Continue on >3L fluids daily, and half tab chlorthalidone daily.  Urine sent for Ca/Cr and citrate.  Increase K citrate to 10 mEq twice daily. Script adjusted.  Obtain urine before morning dose and take to lab.  Amb Referral to [facility]  Check BP once a month for regular monitoring.    Thank you for the opportunity to share in Ronald Wise's care. We have asked that Ronald Wise return to the Adult Nephrology clinic within 6 months. In the interval, please do not hesitate to call with new developments or if you have additional questions or concerns.

## 2021-10-29 LAB — CALCIUM,UR + CREATININE,UR RATIO: Ca/creatinine ratio,Ur: 0.12

## 2021-10-29 LAB — CALCIUM, URINE: Calcium,Ur: 15.1 mg/dL

## 2021-10-31 LAB — CITRATE, URINE
Citric Acid, Ur: 153 mg/L
Citric/Creat: 122 mg/g — ABNORMAL LOW (ref >=150)
Creat,Ur: 125 mg/dL

## 2021-11-04 ENCOUNTER — Other Ambulatory Visit
Admission: RE | Admit: 2021-11-04 | Discharge: 2021-11-04 | Disposition: A | Discharge status: Home / Self Care | Payer: Commercial Managed Care - PPO | Source: Ambulatory Visit | Attending: Pediatric Nephrology | Admitting: Pediatric Nephrology

## 2021-11-04 DIAGNOSIS — N200 Calculus of kidney: Secondary | ICD-10-CM | POA: Insufficient documentation

## 2021-11-04 LAB — CREATININE, URINE: Creatinine,UR: 29 mg/dL (ref 20–300)

## 2021-11-06 LAB — CITRATE, URINE
Citric Acid, Ur: 24 mg/L
Citric/Creat: 83 mg/g — ABNORMAL LOW (ref >=150)
Creat,Ur: 29 mg/dL

## 2022-01-07 ENCOUNTER — Telehealth: Payer: Self-pay

## 2022-01-07 NOTE — Telephone Encounter (Signed)
Left a voice mail and a my chart message for pt to call back to reschedule his appt  on 02/12/22. Please contact Judi for an appt date

## 2022-01-14 ENCOUNTER — Telehealth: Payer: Self-pay

## 2022-01-14 NOTE — Telephone Encounter (Signed)
Patient has been scheduled for an appointment on April 16, 2022 with Dr. Ladona Ridgel.    Was the appointment placed on the wait list? No    Patient's preferred lab is: ur (Please remind patient to complete lab work between 1 to 3 weeks of their scheduled appointment).    Was the referral attached? Yes

## 2022-02-12 ENCOUNTER — Ambulatory Visit: Payer: Commercial Managed Care - PPO | Admitting: Nephrology

## 2022-04-04 ENCOUNTER — Other Ambulatory Visit: Payer: Self-pay | Admitting: Gastroenterology

## 2022-04-04 ENCOUNTER — Inpatient Hospital Stay: Admit: 2022-04-04 | Discharge: 2022-04-04 | Disposition: A | Discharge status: Home / Self Care | Payer: Self-pay

## 2022-04-04 ENCOUNTER — Telehealth: Payer: Self-pay | Admitting: Pediatrics

## 2022-04-04 NOTE — Telephone Encounter (Signed)
Received call     Aadarsh is at school in [place].    Right sided pain. He woke up from deep sleep vomiting a few hours ago. He has taken ibuprofen but says the pain in unbearable and he feels like he is going to pass out from the pain. Not wanting to eat or drink.     Reviewed indications for ED visit which include uncontrolled pain, vomiting, or concern for infection. With level of pain and vomiting he should be seen at local ED. Mom will let him know, [facility] she thinks there is an ED there.     Yer Castello Sissy Hoff, MD

## 2022-04-15 NOTE — Progress Notes (Unsigned)
[facility] [place] [facility] Clinic Note:     I had the pleasure of seeing Ronald Wise, a 21 y.o. Male for new patient consultation. he is referred for recurrent nephrolithiasis and ? Of hypertension.    Goes to school in [place] (between [place] and [place])    HPI:   Ronald Wise is a 21 y.o. male with a significant past medical hx of recurrent calcium oxalate kidney stones, ADHD, ? Of white coat hypertension.    Stone history:   First stone event was age 71.  he has not had prior procedures for this. He does not have a urologist.    Recent stone event while at college ([place]), R flank pain, hematuria. He believes he passed the stone.  He reports CT scan.    Current symptoms include - none.    Medications include Chlorthalidone, K Citrate    Stone analysis previously showed Ca. Oxalate.    24 hr urine ([facility]) - none    Stone Diet History:  Fluid intake: estimates his fluid intake at 2-3 L.  Sodium intake - he is on low sodium diet, but finds this challenging from time to time.  Protein intake: on moderated protein diet.  Oxalate intake:The patient has been counseled in the past re: high oxalate foods to avoid  Calcium intake:previously, he has been told to avoid calcium-containing foods.  Avoids dark sodas, sugary drinks.    CT scan from 9th December ([facility])  KIDNEYS/URETERS: 3 mm stone is present at the right ureteropelvic junction.  This results in mild right hydronephrosis.   Small nonobstructing stone within the lower pole right kidney.  A few additional tiny nonobstructing punctate stones are suspected at the right kidney.   Otherwise unremarkable.     Assessment:    Ronald Wise is a 21 y.o. Male with nephrolithiasis, Ca Oxalate stones.     # Nephrolithiasis, Ca Oxalate   - prior CaOxalate stones. On Ca Citrate, Chlorthalidone   - Continue current meds   - Continue general kidney stone dietary advise (high fluid, low sodium, moderated protein.  Watch oxalate. Advised normal Ca in diet.    - Given recent stone (see below), young age, recurrent stones, did recommend him having urology care as well, referred to [facility]   - We will get 24 hr urine for risk factors    # Blood Pressure and volume status:  - Diagnosis is  White Coat Hypertension   - Note 24 hr BP high end of normal.    - On Chlorthalidone for stone prevention.  - Discussed with him that this may mask hypOtension due to thiazide.    - recent stone   - sounds clinically like it is passed. Had 3mm stone near [facility] prior to this (9 Dec)  - he will reach out if it doesn't pass and we can repeat imaging then    Return to clinic in 6 months with labs 1 week before next appointment.     Thank you very much for allowing me to share in the care of this patient. Do not hesitate to call with any questions or concerns.     Molly Maduro, MD        Supporting data:    Medical History:  Past Medical History:   Diagnosis Date    ADD (attention deficit disorder)     Seasonal allergies         No past surgical history on file.  Family History   Problem Relation Age of Onset    Breast cancer Mother 39    Urolithiasis Father     Urolithiasis Maternal Aunt     Urolithiasis Maternal Grandfather     Kidney Disease Maternal Grandfather     Prostate cancer Maternal Grandfather 69       Social History     Socioeconomic History    Marital status: Single   Tobacco Use    Smoking status: Never    Smokeless tobacco: Never   Substance and Sexual Activity    Alcohol use: No    Drug use: No   Social History Narrative    LIves at home with older sister whoh is well. Dad and mom       Outpatient Medications (Start of Encounter):  Current Outpatient Medications   Medication Sig    potassium citrate (UROCIT-K) 10 mEq (1080 mg) CR tablet Take 1 tablet (10 mEq total) by mouth 2 times daily  for Hypocitraturic Calcium Oxalate Kidney Stones    chlorthalidone (HYGROTEN) 25 mg tablet Take 0.5 tablets (12.5 mg total) by mouth  every morning    methylphenidate (RITALIN) 20 mg tablet Take 1 tablet (20 mg total) by mouth daily    lisdexamfetamine (VYVANSE) 40 MG capsule Take 1 capsule (40 mg total) by mouth every morning  for Attention Deficit Hyperactivity Disorder    cetirizine (ZYRTEC) 10 MG tablet Take 1 tablet (10 mg total) by mouth daily     No current facility-administered medications for this visit.       Allergies:   Allergies   Allergen Reactions    Ketorolac Hives and Shortness Of Breath    Environmental Allergies Other (See Comments)     Unknown      Penicillins Rash     Created by Conversion - 0;     No Known Latex Allergy      Created by Conversion - 0;     Meningococcal Group B Vaccine Other (See Comments)     ?         Physical Examination:  Blood pressure 143/79, pulse (!) 123, height 1.778 m (5\' 10" ), weight 91.6 kg (202 lb).     Weight:  Last 4 Weights    04/16/22 1307   Weight: 91.6 kg (202 lb)       Vitals:    04/16/22 1307   BP: 143/79   Pulse: (!) 123   Weight: 91.6 kg (202 lb)   Height: 1.778 m (5\' 10" )      General: Appears generally well  .  HEENT: EOMI and Supple, trachea midline  CV: extremities warm, well perfused  Pulm: Normal WOB on room air  Abd: non-distended  Extremities: Normal muscle bulk and tone.  Edema: There is no LE edema   Skin: Clean, dry, there is no rash on visible skin   Neuro: Awake, alert and NAD     Lab Results:       Component Value Date/Time    NA 138 10/23/2019 1027    K 3.8 10/23/2019 1027    CL 99 10/23/2019 1027    CO2 28 10/23/2019 1027    UN 12 10/23/2019 1027    CREAT 0.80 10/23/2019 1027    GFRB CANCELED 10/23/2019 1027    GLU 133 (H) 10/23/2019 1027    CA 10.1 10/23/2019 1027    PO4 2.7 10/23/2019 1027    UCRR 29 11/04/2021 0845    MALBR <1.20 10/10/2021 1235  Component Value Date/Time    WBC 16.6 (H) 11/03/2017 1356    HGB 16.3 11/03/2017 1356    HCT 48 11/03/2017 1356    PLT 265 11/03/2017 1356        @LABRCNTIP (UNAR:2,UUNR:2,UTPR:2,UCRR:2,UKR:2,UCLR:2,UOSMO:2)@          Component Value Date/Time    VOLU 1,720 08/19/2018 1521    UCAC CANCELED 09/30/2018 0000    UUAR 24.9 08/17/2018 1200       No results for input(s): "CREAT" in the last 8760 hours.    No results for input(s): "TPCREATRATIO", "UCRR", "UTPR" in the last 72 hours.      Urine Protein Studies:     Total Protein (grams/day NML is < 0.150)  No results found for: "TPCREATRATIO"     Albumin (mg/day, NML is < 30)  Microalb/Creat Ratio   Date/Time Value Ref Range Status   10/10/2021 12:35 PM see below 0.0 - 29.9 mg MA/g CR Final     Comment:     Not calculated since one of the components of the calculation  is outside of the analyzer's measurable range.          Imaging Results:      Imaging:  No results found for this or any previous visit from the past 8760 hours.       No results found for this or any previous visit from the past 8760 hours.      _____________________________________________________    Please see top of note for assessment and plan.      Electronically signed on 04/16/2022 1:58 PM

## 2022-04-16 ENCOUNTER — Ambulatory Visit: Payer: Commercial Managed Care - PPO | Admitting: Nephrology

## 2022-04-16 ENCOUNTER — Other Ambulatory Visit: Payer: Self-pay

## 2022-04-16 VITALS — BP 143/79 | HR 123 | Ht 70.0 in | Wt 202.0 lb

## 2022-04-16 DIAGNOSIS — N200 Calculus of kidney: Secondary | ICD-10-CM

## 2022-04-16 DIAGNOSIS — Z1389 Encounter for screening for other disorder: Secondary | ICD-10-CM

## 2022-04-16 DIAGNOSIS — R030 Elevated blood-pressure reading, without diagnosis of hypertension: Secondary | ICD-10-CM

## 2022-04-16 MED ORDER — POTASSIUM CITRATE 1080 MG PO TBCR *I*
10.0000 meq | ORAL_TABLET | Freq: Two times a day (BID) | ORAL | 3 refills | Status: DC
Start: 2022-04-16 — End: 2022-11-30

## 2022-04-16 MED ORDER — CHLORTHALIDONE 25 MG PO TABS *I*
12.5000 mg | ORAL_TABLET | Freq: Every morning | ORAL | 3 refills | Status: AC
Start: 2022-04-16 — End: ?

## 2022-04-16 NOTE — Patient Instructions (Signed)
Oxalate Diet Instructions    These are two lists of the highest oxalate foods. The top list is extremely high and you should avoid to lower oxalate in your urine.  The bottom list is pretty high, so you should just eat these in small portions and sparingly.     The list comes from a website by a kidney stone expert named Frederic Coe in [place], his website is helpful and has more information if you want. https://kidneystones.[place].edu/2014/03/12/how-to-eat-a-low-oxalate-diet/comment-page-1/       Swiss Chard may have more than 100mg to as much as 500 mg oxalate / serving, so would avoid this too.

## 2022-05-05 ENCOUNTER — Ambulatory Visit: Payer: Commercial Managed Care - PPO | Admitting: Nephrology

## 2022-07-03 ENCOUNTER — Other Ambulatory Visit: Payer: Self-pay | Admitting: Urology

## 2022-07-03 DIAGNOSIS — R3989 Other symptoms and signs involving the genitourinary system: Secondary | ICD-10-CM

## 2022-07-06 ENCOUNTER — Ambulatory Visit: Payer: Commercial Managed Care - PPO

## 2022-07-06 ENCOUNTER — Other Ambulatory Visit: Payer: Self-pay

## 2022-07-06 VITALS — Ht 70.0 in | Wt 202.0 lb

## 2022-07-06 DIAGNOSIS — N200 Calculus of kidney: Secondary | ICD-10-CM

## 2022-07-06 DIAGNOSIS — R82994 Hypercalciuria: Secondary | ICD-10-CM

## 2022-07-06 DIAGNOSIS — R3989 Other symptoms and signs involving the genitourinary system: Secondary | ICD-10-CM

## 2022-07-06 LAB — POCT URINALYSIS DIPSTICK
Blood,UA POCT: NEGATIVE
Glucose,UA POCT: NORMAL mg/dL
Ketones,UA POCT: NEGATIVE mg/dL
Leuk Esterase,UA POCT: NEGATIVE
Lot #: 70153302
Nitrite,UA POCT: NEGATIVE
PH,UA POCT: 5 (ref 5–8)
Protein,UA POCT: NEGATIVE mg/dL
Specific gravity,UA POCT: 1.015 (ref 1.002–1.030)

## 2022-07-06 NOTE — Progress Notes (Signed)
[facility] Note    Chief complaint: kidney stones    History of present illness: Ronald Wise is a 22 y.o. male here for a new patient visit. Pt recently developed acute renal colic, in December 99991111. Pt was seen in the ED. Previous imaging at [facility], has shown 3 mm stone at the right uteropelvic junction and mild right hydronephrosis.     Previous ly saw Dr. York Cerise in 2019 for kidney stones.  Has been seeing a nephrologist for his kidney stone management with Chlorthalidone and potassium citrate.    PMH significant for:     Urological-related family history includes Nephrolithiasis in his father, maternal aunt, and maternal grandfather; Prostate cancer in his paternal uncle; Prostate cancer (age of onset: 6) in his maternal grandfather. There is no history of Kidney cancer or Bladder Cancer.  Pt denies smoking history.    Stone Disease History    First stone event was ~ 2019  Lifetime stone events - 5  Prior stone surgeries - none  Prior stone passages - 5, flomax and pain killers  Prior ER visits - 3    Stone analysis:  Calculi composed primarily of  calcium oxalate dihydrate.       24 hour urinalysis:         Component  Ref Range & Units 3 yr ago  (08/19/18) 3 yr ago  (08/17/18) 3 yr ago  (08/17/18)   Date Start 08/18/2018  08/16/2018   Time Start 11 50  11 40   Date End 08/19/2018  08/17/2018   Time End 11 50  11 40   Collection Period  hrs 24.0 24.0 R 24.0   Total Volume,UR  mL 1,550 2,200 2,200   Urine Volume  mL 1,720     Comment: Litholink automatically generates a pediatric report with  reference ranges normalized for patients 30 years of age or  younger.   Super Saturation,Ur See Report     Comment: Complete report from reference laboratory mailed/faxed to  requesting provider or scanned into eRecord.    Testing performed at: [place], [place]. [address]   Citric Acid, Ur  105 R    Citric Acid,24Hr  231 R    Citric/Creat  162 R, CM    Creat,Ur  65 R    Creat,Ur 24hr  1,430  VC, R, CM    Calcium,Ur   10.7 R   Calcium/24H,UR   235 R, CM       Co-morbidities related to stone disease:     Current stone prevention: Chlorthalidone and potassium citrate    Pt reports family history of nephrolithiasis.  Father, maternal grandfather, maternal Aunt    Kidney Mass:  Date of onset:  05/28/2017  Progression since onset:  Gradually improving  Have you seen a urologist?:  No  Have you ever needed a catheter before?:  No  Describe the blood in your urine if you have it?:  None  Are you on anticoagulation / blood thinners?:  No  Smoking history?:  No  Any history of chemical exposures?:  No  What is your goal for your visit:  Meet my nee Urologist and get a basic understanding of how to manage kidney stones better.  Pain - numeric:  0/10  Prior workup:  CT scan, labs, ultrasound and urine tests  Treatments tried:  A change of diet, exercise and medications  Improvement on Treatment:  Dramatic change      Today pt states  that the right flank pain began in December. States no pain currently. Saw the stone passed middle of December. Currently drinking 2-3L per day of water.    Pt denies nausea.   Pt denies dysuria.   Pt denies LUTS.   Pt denies abdominal or flank pain.   Pt denies any fever or chills.   Pt denies gross hematuria.   Pt denies recent UTI.   Pt denies malodorous urine.   Pt denies use of calcium supplements, such as TUMS, calcium carbonate, multivitamin, calcium plus vitamin D.      Medications:   Current Outpatient Medications   Medication    potassium citrate (UROCIT-K) 10 mEq (1080 mg) CR tablet    chlorthalidone (HYGROTEN) 25 mg tablet    methylphenidate (RITALIN) 20 mg tablet    lisdexamfetamine (VYVANSE) 40 MG capsule    cetirizine (ZYRTEC) 10 MG tablet     No current facility-administered medications for this visit.       Allergies:   Allergies   Allergen Reactions    Ketorolac Hives and Shortness Of Breath    Environmental Allergies Other (See Comments)     Unknown      Penicillins  Rash     Created by Conversion - 0;     No Known Latex Allergy      Created by Conversion - 0;     Meningococcal Group B Vaccine Other (See Comments)     ?       Past medical history:   Past Medical History:   Diagnosis Date    ADD (attention deficit disorder)     Seasonal allergies        Past surgical history: No past surgical history on file.    Family history:   Family History   Problem Relation Age of Onset    Breast cancer Mother 59    Nephrolithiasis Father     Urolithiasis Father     Nephrolithiasis Maternal Aunt     Urolithiasis Maternal Aunt     Prostate cancer Paternal Uncle     Nephrolithiasis Maternal Grandfather     Urolithiasis Maternal Grandfather     Kidney Disease Maternal Grandfather     Prostate cancer Maternal Grandfather 73    Kidney cancer Neg Hx     Bladder Cancer Neg Hx        Social History:   Social History     Socioeconomic History    Marital status: Single   Tobacco Use    Smoking status: Never    Smokeless tobacco: Never   Substance and Sexual Activity    Alcohol use: No    Drug use: No   Social History Narrative    LIves at home with older sister whoh is well. Dad and mom         ROS-Urology    Vital signs: Height 1.778 m ('5\' 10"'$ ), weight 91.6 kg (202 lb).    Urine analysis shows:   Recent Results (from the past 24 hour(s))   POCT urinalysis dipstick    Collection Time: 07/06/22 10:09 AM   Result Value Ref Range    Specific gravity,UA POCT 1.015 1.002 - 1.030    PH,UA POCT 5.0 5 - 8    Leuk Esterase,UA POCT Negative Negative    Nitrite,UA POCT Negative Negative    Protein,UA POCT Negative Negative mg/dL    Glucose,UA POCT Normal Normal mg/dL    Ketones,UA POCT Negative Negative mg/dL    Urobilinogen,UA Test Not  Performed Less than 1 mg/dL    Bilirubin,Ur Test Not Performed Negative, Test Not Performed    Blood,UA POCT Negative Negative    Exp date 9.30.24     Lot # [id number]          Questionnaire Scores:  Urology Questionnaire Scores           No data to display                        Physical examination:  GENERAL:  No acute distress, well developed, well nourished  NEUROLOGIC:  Oriented to person, place, time, and situation  PSYCHIATRIC:  Normal mood and affect  HEENT:  Normocephalic, atraumatic.  Oropharynx clear.  Trachea midline.   SKIN:  Normal color, turgor, texture, hydration      Radiologic evaluation included: CT Scan    CT abdomen and pelvis without contrast - 04/04/2022  FINDINGS:   LOWER CHEST: Clear lung bases.     LIVER: Unremarkable.     GALLBLADDER/BILE DUCTS: Unremarkable.     PANCREAS: Unremarkable.     GI TRACT: Normal appendix. No active inflammatory changes seen involving bowel. No bowel obstruction.     SPLEEN: Unremarkable.     LYMPH NODES: No lymphadenopathy.     ADRENAL GLANDS: Unremarkable.     KIDNEYS/URETERS: 3 mm stone is present at the right ureteropelvic junction.  This results in mild right hydronephrosis. Small nonobstructing stone within the lower pole right kidney.  A few additional tiny nonobstructing punctate stones are suspected at the right kidney. Otherwise unremarkable.     URINARY BLADDER: Unremarkable.     REPRODUCTIVE ORGANS: Prostate gland appears under     VASCULATURE: No abdominal aortic aneurysm.     MISCELLANEOUS: No free fluid or free air.     MUSCULOSKELETAL: Several small multilevel endplate Schmorl's nodes mild endplate spondylosis of the visualized spine.     Assessment/Plan:  1. Nephrolithiasis  - Basic metabolic panel; Future  - Phosphorus; Future  - Vitamin D; Future  - PTH; Future  - Complete 24 hour urine ordered by nephrology.    2. Hypercalcinuria  -Chlorthiadone managed by nephrology  -Potassium Citrate managed by nephrology    3. Urinary problem  - POCT urinalysis dipstick       Follow up:  Follow up in about 3 months (around 10/06/2022) for Follow-up, BMP, PTH, Vit D, [facility] (24 hour Urine) with Erin R.      Advised to call the office at [phone] or go the Emergency Department for any of the following symptoms: fever  greater than 101F, uncontrolled pain, unable to urinate, hematuria, nausea/vomiting. No further questions or concerns at this time.    Fayette Pho, NP    07/06/2022  10:45 AM

## 2022-07-13 ENCOUNTER — Other Ambulatory Visit: Payer: Self-pay

## 2022-09-17 ENCOUNTER — Ambulatory Visit: Payer: Commercial Managed Care - PPO | Admitting: Nephrology

## 2022-10-06 ENCOUNTER — Ambulatory Visit: Payer: Commercial Managed Care - PPO | Admitting: Urology

## 2022-10-09 ENCOUNTER — Encounter: Payer: Self-pay | Admitting: Gastroenterology

## 2022-10-09 ENCOUNTER — Other Ambulatory Visit: Payer: Self-pay | Admitting: Urology

## 2022-10-09 DIAGNOSIS — R3989 Other symptoms and signs involving the genitourinary system: Secondary | ICD-10-CM

## 2022-10-11 ENCOUNTER — Encounter: Payer: Self-pay | Admitting: Gastroenterology

## 2022-10-12 ENCOUNTER — Ambulatory Visit: Payer: Commercial Managed Care - PPO | Attending: Urology | Admitting: Urology

## 2022-10-12 ENCOUNTER — Other Ambulatory Visit: Payer: Self-pay

## 2022-10-12 ENCOUNTER — Encounter: Payer: Self-pay | Admitting: Urology

## 2022-10-12 VITALS — Ht 71.0 in | Wt 205.0 lb

## 2022-10-12 DIAGNOSIS — N200 Calculus of kidney: Secondary | ICD-10-CM

## 2022-10-12 NOTE — Progress Notes (Signed)
[facility] Follow Up Note:    CHIEF COMPLAINTS: History of nephrolithiasis    Subjective   HISTORY AND PRESENT ILLNESS:   Nephrolithiasis  Have you ever needed a catheter before?:  No  Describe the blood in your urine if you have it?:  None  Are you on anticoagulation / blood thinners?:  No  Smoking history?:  No  Pain - numeric:  0/10  Prior workup:  Labs and other      Ronald Wise was seen in the [facility] clinic for a follow-up visit regarding their nephrolithiasis. As you know, he is a 22 y.o. male with urolithiasis.        He reports URO; Stone FU Events: no symptomatic stone events since their last visit.   These were associated with no symptoms.      At their last visit, the plan was for metabolic stone blood work.     The patient was started on no new medications at their last visit. - taking his stone related medications as directed by nephrology. He is on chlorthalidone and potassium citrate.     no diet change was recommended at the patient's last visit. - did not have any new     Anticoagulation meds: This patient does not have an active medication from one of the medication groupers.     Stone Disease History  -Prior stone: stones since age 38-20  -Number of previous stone episodes: >=6  -Prior stone surgeries - none  -Prior stone passages - 6    Risk Factors  -History of GI/Bariatric surgery: no history of GI/bariatric surgery  -Urinary reconstruction: no previous history of urinary reconstruction  -Anatomic anomalies: no urinary tract anomalies  -Other: none    Stone analysis:    Calcium   Date Value Ref Range Status   10/23/2019 10.1 9.3 - 10.5 mg/dL Final     Ca Oxalate Crys,UA   Date Value Ref Range Status   11/03/2017 Present (!)  Final     Calcium,Ur   Date Value Ref Range Status   10/27/2021 15.1 mg/dL Final     [id number]/16W,FU   Date Value Ref Range Status   09/30/2018 CANCELED 100 - 240 mg/24hr      Comment:     REFERENCE RANGE:  LOW CA DIET   0-150 mg/24hr  HIGH CA DIET  240-300 mg/24hr    Result canceled by the ancillary.       Calculi Composition   Date Value Ref Range Status   10/08/2021 See Note  Final     Comment:     Calculi composed primarily of  calcium oxalate dihydrate.  INTERPRETIVE INFORMATION: Calculi (Stone) analysis  Calculi are the products of physiological processes that yield  crystalline compounds in a matrix of biological compounds and  blood.  Matrix components are not reported.  The clinically  significant crystalline components identified in calculi specimens  are reported.  Gross description may not be consistent with  composition determined by FTIR analysis.  Performed By: [facility]  [address]  [place], [place] [address]  Laboratory Director: Evans Lance, MD, PhD       Calculi Mass   Date Value Ref Range Status   10/08/2021 26 mg Final     Calculi Number   Date Value Ref Range Status   06/19/2017 2  Final     24 hour urinalysis:   none  Stone prevention medications:  Chlorthalidone, potassium citrate,.  Family history of bladder, kidney, prostate Ca or nephrolithiasis:  Urological-related family history includes Nephrolithiasis in his father, maternal aunt, and maternal grandfather; Prostate cancer in his paternal uncle; Prostate cancer (age of onset: 54) in his maternal grandfather. There is no history of Kidney cancer or Bladder Cancer.    Questionnaire Scores:       Urology Questionnaire Scores           No data to display              AUA Questions:            Objective     PHYSICAL EXAM:  Ht 1.803 m (5\' 11" )   Wt 93 kg (205 lb)   BMI 28.59 kg/m   GENERAL:  No acute distress, well developed, well nourished  NEUROLOGIC:  Oriented to person, place, time, and situation  PSYCHIATRIC:  Normal mood and affect  SKIN:  Normal color    LABS:  Lab Results   Component Value Date    CA 10.1 10/23/2019    CA 9.8 09/30/2018    CA 9.6 11/03/2017    CREAT 0.80 10/23/2019    CREAT 0.75 09/30/2018    CREAT 0.78 11/03/2017     No results found for  this or any previous visit (from the past 24 hour(s)).      IMAGING:  his recent imaging (abdominal and pelvic CT without/with IV contrast, renal US, bone scan, renal scan, or CXR) were all reviewed. Findings were discussed with patient.    Imaging in past 30 days:  No results found.       Assessment & Plan     1. Kidney stone  - US renal retroperitoneal complete; Future      I addressed urolithiasis with the patient today, which is a follow up of a pre-existing problem for [place]. This issue is stable.      Based on today's visit, we will plan for Uro Stone Plan FU: imaging.     We plan to proceed with behavioral modification including:  Continue high fluid intake  Continue on chlorthalidone and potassium citrate which is managed by nephrology.   Passed stone in December. Would check renal US to ensure hydronephrosis has resolved  He just completed 24 hour urine yesterday. He will follow with nephrology for review of this.     Follow up with renal US.       Follow up:  Follow up in about 6 months (around 04/13/2023) for Renal US. .     General and specific aspects of the prevention and management of kidney stone disease were discussed. The patient had the opportunity to ask questions and discuss the management plan. We are available if there are any concerns. Counseling and care coordination was included. The patient also had the opportunity to ask questions.      Annitta Needs, NP  [facility]

## 2022-10-12 NOTE — Patient Instructions (Signed)
Kidney Stone Episode (24 hours/7 days/week)  STONE HOTLINE: Call [phone]  OFFICE NUMBER: [phone]

## 2022-10-13 NOTE — Telephone Encounter (Signed)
Waiting for the results to come in before setting up an appointment

## 2022-10-15 ENCOUNTER — Ambulatory Visit: Payer: Commercial Managed Care - PPO | Admitting: Nephrology

## 2022-11-03 ENCOUNTER — Encounter: Payer: Self-pay | Admitting: Nephrology

## 2022-11-11 ENCOUNTER — Other Ambulatory Visit: Payer: Self-pay

## 2022-11-11 ENCOUNTER — Ambulatory Visit
Admission: RE | Admit: 2022-11-11 | Discharge: 2022-11-11 | Disposition: A | Discharge status: Home / Self Care | Payer: Commercial Managed Care - PPO | Source: Ambulatory Visit | Attending: Urology | Admitting: Urology

## 2022-11-11 DIAGNOSIS — N200 Calculus of kidney: Secondary | ICD-10-CM | POA: Insufficient documentation

## 2022-11-30 ENCOUNTER — Other Ambulatory Visit: Payer: Self-pay

## 2022-11-30 ENCOUNTER — Ambulatory Visit: Payer: Commercial Managed Care - PPO | Admitting: Nephrology

## 2022-11-30 VITALS — BP 136/94 | HR 101 | Ht 71.65 in | Wt 216.5 lb

## 2022-11-30 DIAGNOSIS — N200 Calculus of kidney: Secondary | ICD-10-CM

## 2022-11-30 MED ORDER — POTASSIUM CITRATE 1080 MG PO TBCR *I*
20.0000 meq | ORAL_TABLET | Freq: Two times a day (BID) | ORAL | 3 refills | Status: AC
Start: 2022-11-30 — End: ?

## 2022-11-30 MED ORDER — POTASSIUM CITRATE 1080 MG PO TBCR *I*
20.0000 meq | ORAL_TABLET | Freq: Two times a day (BID) | ORAL | 3 refills | Status: DC
Start: 2022-11-30 — End: 2022-11-30

## 2022-11-30 NOTE — Patient Instructions (Signed)
We'll try increasing your Potassium Citrate - if you have side effects let me know.    WE talked about some citrate supplements that could be used instead - the two I've had good experiences with;    Litholyte  [facility]pper

## 2022-11-30 NOTE — Progress Notes (Signed)
[facility] [place] Clinic Note:     I had the pleasure of seeing Ronald Wise, a 22 y.o. Male following up for nephrolithiasis. he was seen for initial evaluation in Dec 2023 by myself.    Summary of Prior Renal Care:  2018 - first kidney stone  2018-2023 followed w Dr Grover Canavan, on CTD, UroCit K as well as general stone advice  Dec 2023 - established [facility] for nephrolithiasis. Was already on K Citrate and Chlorthalidone.  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 On chlorthalidone and citrate.    Since his last visit, has been well.  No stone events.    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     Labs in June reviewed, available in Media - done in [place]    [place] with urology last month showed small nonobstructing stone on the right - plan for observation.     Stone history (from [facility])  First stone event was age 46.  he has not had prior procedures for this. He does not have a urologist.     Recent stone event [place] at college ([place]), R flank pain, hematuria. He believes he passed the stone.  He reports CT scan.     Current symptoms include - none.     Medications include Chlorthalidone, K Citrate     Stone analysis previously showed Ca. Oxalate.     24 hr urine (Litholink) - none     Stone Diet History:  Fluid intake: estimates his fluid intake at 2-3 L.  Sodium intake - he is on low sodium diet, but finds this challenging from time to time.  Protein intake: on moderated protein diet.  Oxalate intake:The patient has been counseled in the past re: high oxalate foods to avoid  Calcium intake:previously, he has been told to avoid calcium-containing foods.  Avoids dark sodas, sugary drinks.     CT scan from 9th  December (Care Everywhere)  KIDNEYS/URETERS: 3 mm stone is present at the right ureteropelvic junction.  This results in mild right hydronephrosis.   Small nonobstructing stone within the lower pole right kidney.  A few additional tiny nonobstructing punctate stones are suspected at the right kidney.   Otherwise unremarkable.      Assessment:    Ronald Wise is a 22 y.o. Male with nephrolithiasis, Ca Oxalate stones.      # Nephrolithiasis, Ca Oxalate   - prior CaOxalate stones. On Ca Citrate, Chlorthalidone                 - We will increase Uro Cit K 20 mEQ BID                 - Continue general kidney stone dietary advise (high fluid, low sodium, moderated protein. Watch oxalate. Advised normal Ca in diet.                  - Now following with [facility]                 - We will get 24 hr urine next after 3-4 months on increased uro cit K     # Blood Pressure and volume status:  - Diagnosis  is  White Coat Hypertension   - Note 24 hr BP high end of normal.    - On Chlorthalidone for stone prevention.  - Discussed with him that this may mask hypOtension due to thiazide.           Return to clinic in 4-5 months with labs 1 week before next appointment.     Thank you very much for allowing me to share in the care of this patient. Do not hesitate to call with any questions or concerns.     Molly Maduro, MD      Supporting data:    Medical History:  Past Medical History:   Diagnosis Date    ADD (attention deficit disorder)     Environmental allergies 01/2018    Hypertension 2022    Seasonal allergies         No past surgical history on file.    Family History   Problem Relation Age of Onset    Breast cancer Mother 51    Cancer Mother         Breast Cancer    Nephrolithiasis Father     Urolithiasis Father     Nephrolithiasis Maternal Aunt     Urolithiasis Maternal Aunt     Prostate cancer Paternal Uncle     Nephrolithiasis Maternal Grandfather     Urolithiasis Maternal Grandfather     Kidney Disease Maternal  Grandfather     Prostate cancer Maternal Grandfather 79    Cancer Maternal Grandfather         Prostate Cancer    Diabetes Maternal Grandfather     Diabetes Maternal Grandmother     Cancer Paternal Grandmother         Uterine Cancer    Diabetes Paternal Uncle     Kidney cancer Neg Hx     Bladder Cancer Neg Hx        Social History     Socioeconomic History    Marital status: Single   Tobacco Use    Smoking status: Never    Smokeless tobacco: Never   Substance and Sexual Activity    Alcohol use: No    Drug use: No    Sexual activity: Never     Birth control/protection: Diaphragm   Social History Narrative    LIves at home with older sister whoh is well. Dad and mom       Outpatient Medications (Start of Encounter):  Current Outpatient Medications   Medication Sig    potassium citrate (UROCIT-K) 10 mEq (1080 mg) CR tablet Take 2 tablets (20 mEq total) by mouth 2 times daily for Hypocitraturic Calcium Oxalate Kidney Stones.    chlorthalidone (HYGROTEN) 25 mg tablet Take 0.5 tablets (12.5 mg total) by mouth every morning    methylphenidate (RITALIN) 20 mg tablet Take 1 tablet (20 mg total) by mouth daily.    lisdexamfetamine (VYVANSE) 40 MG capsule Take 1 capsule (40 mg total) by mouth every morning for Attention Deficit Hyperactivity Disorder.    cetirizine (ZYRTEC) 10 MG tablet Take 1 tablet (10 mg total) by mouth daily.     No current facility-administered medications for this visit.       Allergies:   Allergies   Allergen Reactions    Ketorolac Hives and Shortness Of Breath    Environmental Allergies Other (See Comments)     Unknown      Penicillins Rash     Created by Conversion - 0;  No Known Latex Allergy      Created by Conversion - 0;     Meningococcal Group B Vaccine Other (See Comments)     ?         Physical Examination:  Blood pressure (!) 136/94, pulse 101, height 1.82 m (5' 11.65"), weight 98.2 kg (216 lb 8 oz), SpO2 98%.     Weight:  Last 4 Weights    11/30/22 1539   Weight: 98.2 kg (216 lb 8 oz)        Vitals:    11/30/22 1539   BP: (!) 136/94   Pulse: 101   Weight: 98.2 kg (216 lb 8 oz)   Height: 1.82 m (5' 11.65")        General: Appears generally well  .  HEENT: EOMI and Supple, trachea midline  CV: extremities warm, well perfused  Pulm: Normal WOB on room air  Abd: non-distended  Extremities: Normal muscle bulk and tone.  Edema: There is no LE edema   Skin: Clean, dry, there is no rash on visible skin   Neuro: Awake, alert and NAD     Lab Results:       Component Value Date/Time    NA 138 10/23/2019 1027    K 3.8 10/23/2019 1027    CL 99 10/23/2019 1027    CO2 28 10/23/2019 1027    UN 12 10/23/2019 1027    CREAT 0.80 10/23/2019 1027    GFRB CANCELED 10/23/2019 1027    GLU 133 (H) 10/23/2019 1027    CA 10.1 10/23/2019 1027    PO4 2.7 10/23/2019 1027    UCRR 29 11/04/2021 0845    MALBR <1.20 10/10/2021 1235          Component Value Date/Time    WBC 16.6 (H) 11/03/2017 1356    HGB 16.3 11/03/2017 1356    HCT 48 11/03/2017 1356    PLT 265 11/03/2017 1356        @LABRCNTIP (UNAR:2,UUNR:2,UTPR:2,UCRR:2,UKR:2,UCLR:2,UOSMO:2)@         Component Value Date/Time    VOLU 1,720 08/19/2018 1521    UCAC CANCELED 09/30/2018 0000    UUAR 24.9 08/17/2018 1200       No results for input(s): "CREAT" in the last 8760 hours.    No results for input(s): "TPCREATRATIO", "UCRR", "UTPR" in the last 72 hours.        Urine Protein Studies:     Total Protein (grams/day NML is < 0.150)  No results found for: "TPCREATRATIO"     Albumin (mg/day, NML is < 30)  Microalb/Creat Ratio   Date/Time Value Ref Range Status   10/10/2021 12:35 PM see below 0.0 - 29.9 mg MA/g CR Final     Comment:     Not calculated since one of the components of the calculation  is outside of the analyzer's measurable range.          Imaging Results:    Imaging:  US renal retroperitoneal complete 11/11/2022    Narrative  11/11/2022 10:26 AM    RENAL ULTRASOUND    CLINICAL INFORMATION: Follow up kidney stone, N20.0-Calculus of kidney.    COMPARISON: Renal  ultrasound 12/07/2019, CT abdomen and pelvis 04/04/2022.    TECHNIQUE:  Sonographic evaluation of the kidneys and bladder was performed using grayscale and color Doppler ultrasound.    FINDINGS:    Right Kidney:  10.1 cm in length.  Echogenicity: Normal.  Hydronephrosis: None.  Calculi: Yes  Right Stone 1: 0.4 cm Location:  Lower pole.  Focal Lesions: None  Other: None.    Left Kidney: 10.7 cm in length.  Echogenicity: Normal.  Hydronephrosis: None.  Calculi: None  Focal Lesions: None    Other: None.      Aorta: Proximal, mid, and distal visualized. Visualized portions non-aneurysmal.  Proximal Iliacs: Visualized proximal portions non-aneurysmal.  IVC Superior:  Visualized upper portion unremarkable.    Bladder:   Bilateral ureteral jets visualized.    Impression  Nonobstructing right lower pole renal calculus.  No hydronephrosis bilaterally.      END OF IMPRESSION    I have personally reviewed the images and the Resident's/Fellow's interpretation and agree with or edited the findings.        [facility] submits this DICOM format image data and final report to the [facility], an independent secure electronic health information exchange, on a reciprocally searchable basis (with patient authorization) for a minimum of 12 months after exam  date.       No results found for this or any previous visit from the past 8760 hours.          Please see top of note for assessment and plan.      Electronically signed on 11/30/2022 4:10 PM

## 2023-03-03 ENCOUNTER — Telehealth: Payer: Self-pay | Admitting: Nephrology

## 2023-03-03 ENCOUNTER — Encounter: Payer: Self-pay | Admitting: Nephrology

## 2023-03-03 NOTE — Telephone Encounter (Signed)
Copied from CRM [id number]. Topic: Return Call - Speak to Provider/Office Staff  >> Mar 03, 2023 12:59 PM Ronald Wise wrote:  Patient reporting starting yesterday he has been having possible right kidney stone issues. Please review 11/6 mychart encounter

## 2023-03-04 ENCOUNTER — Encounter: Payer: Self-pay | Admitting: Urology

## 2023-03-04 NOTE — Telephone Encounter (Signed)
Writer returned call to patient and spoke with mother, Amy who states she is the one that called for son who is in college in [place].  Amy reports kidney stone flair up with severe pain and hematuria. Patient reported to mother that "there is a lot of blood when I urinate."    Writer informed Amy that nephrology could offer Tylenol. For anything stronger, patient would need to reach out to PCP or urologist.  Writer encouraged increased fluid intake and report to urgent care or ED if pain became unmanageable and/or hematuria worsened.    Amy verbalized understanding and stated she would relay to patient.

## 2023-03-04 NOTE — Telephone Encounter (Signed)
TC to patient, ended up getting his mother on the first try so spoke with her as well.    In [place], no local urologist.    Having renal colic, hematuria. Not as bad as yesterday, but has stopped moving.     I provided him with warning signs of ureteral obstruction (infectious symptoms, diminished output, significantly worsening flank pain), at which point he should be seen in an ED.    Also advised he reach out to urology for help with management of active stone episode (imaging, symptomatic relief).  Will route to [facility], whom he has seen over there in the past.    If ongoing and persistent, will get BMP and ultrasound if can be done locally.    MHT

## 2023-03-11 ENCOUNTER — Encounter: Payer: Self-pay | Admitting: Urology

## 2023-03-12 ENCOUNTER — Inpatient Hospital Stay: Admit: 2023-03-12 | Discharge: 2023-03-12 | Disposition: A | Discharge status: Home / Self Care | Payer: Self-pay

## 2023-03-15 MED ORDER — TAMSULOSIN HCL 0.4 MG PO CAPS *I*
0.4000 mg | ORAL_CAPSULE | Freq: Every evening | ORAL | 0 refills | Status: AC
Start: 2023-03-15 — End: ?

## 2023-03-15 NOTE — Addendum Note (Signed)
Addended by: Hyman Hopes on: 03/15/2023 02:35 PM     Modules accepted: Orders

## 2023-03-15 NOTE — Telephone Encounter (Signed)
Duplicate encounter

## 2023-04-12 ENCOUNTER — Other Ambulatory Visit: Payer: Self-pay | Admitting: Urology

## 2023-04-12 DIAGNOSIS — R3989 Other symptoms and signs involving the genitourinary system: Secondary | ICD-10-CM

## 2023-04-13 ENCOUNTER — Ambulatory Visit: Payer: Commercial Managed Care - PPO | Admitting: Urology

## 2023-04-14 ENCOUNTER — Encounter: Payer: Self-pay | Admitting: Urology

## 2023-04-17 ENCOUNTER — Other Ambulatory Visit: Payer: Self-pay

## 2023-06-24 ENCOUNTER — Ambulatory Visit: Payer: Commercial Managed Care - PPO | Admitting: Nephrology

## 2023-06-24 ENCOUNTER — Telehealth: Payer: Self-pay | Admitting: Nephrology

## 2023-06-24 NOTE — Telephone Encounter (Signed)
 Called patient to see what is going on spoke to his mom Amy she states that he is in college in [place] and is unable to make it to [place]

## 2023-06-24 NOTE — Progress Notes (Deleted)
 [facility] Clinic Note:     I had the pleasure of seeing Ronald Wise, a 23 y.o. male following up for nephrolithiasis. he was seen for initial evaluation in Dec 2023 by myself.    Summary of Prior Renal Care:  2018 - first kidney stone  2018-2023 followed w Dr Grover Canavan, on CTD, UroCit K as well as general stone advice  Dec 2023 - established [facility] for nephrolithiasis. Was already on K Citrate and Chlorthalidone.  June 2024 - 24 hr urine - showed persistent hypocitraturia but much improved SS Ca Ox    Interval history:   Last appt with nephrology was August 2024, had been stable. Imaging with stable stone at that time.    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 On chlorthalidone and citrate.    Since his last visit, has been well.  No stone events.    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     Labs in June reviewed, available in [facility] - done in [place]    [place] with urology last month showed small nonobstructing stone on the right - plan for observation.     Stone history (from [facility])  First stone event was age 11.  he has not had prior procedures for this. He does not have a urologist.     [place] ([place] R flank pain, hematuria. He believes he passed the stone.  He reports CT scan.     Current symptoms include - none.     Medications include Chlorthalidone, K Citrate     Stone analysis previously showed Ca. Oxalate.     24 hr urine ([facility]) - none     Stone Diet History:  Fluid intake: estimates his fluid intake at 2-3 L.  Sodium intake - he is on low sodium diet, but finds this challenging from time to time.  Protein intake: on moderated protein diet.  Oxalate intake:The patient has been counseled in the past re: high oxalate foods to avoid  Calcium intake:previously, he has  been told to avoid calcium-containing foods.  Avoids dark sodas, sugary drinks.     CT scan from 9th December (Care Everywhere)  KIDNEYS/URETERS: 3 mm stone is present at the right ureteropelvic junction.  This results in mild right hydronephrosis.   Small nonobstructing stone within the lower pole right kidney.  A few additional tiny nonobstructing punctate stones are suspected at the right kidney.   Otherwise unremarkable.      Assessment:    Ronald Wise is a 23 y.o. Male with nephrolithiasis, Ca Oxalate stones.      # Nephrolithiasis, Ca Oxalate   - prior CaOxalate stones. On Ca Citrate, Chlorthalidone                 - We will increase Uro Cit K 20 mEQ BID                 - Continue general kidney stone dietary advise (high fluid, low sodium, moderated protein. Watch oxalate. Advised normal Ca in diet.                  - Now following with [facility]                 - We will get  24 hr urine next after 3-4 months on increased uro cit K     # Blood Pressure and volume status:  - Diagnosis is  White Coat Hypertension   - Note 24 hr BP high end of normal.    - On Chlorthalidone for stone prevention.  - Discussed with him that this may mask hypOtension due to thiazide.          Return to clinic in *** with labs 1 week before next appointment.     Thank you very much for allowing me to share in the care of this patient. Do not hesitate to call with any questions or concerns.     Molly Maduro, MD    ***Continued Care?    Supporting data:    Medical History:  Past Medical History:   Diagnosis Date    ADD (attention deficit disorder)     Environmental allergies 01/2018    Hypertension 2022    Seasonal allergies         No past surgical history on file.    Family History   Problem Relation Age of Onset    Breast cancer Mother 49    Cancer Mother         Breast Cancer    Nephrolithiasis Father     Urolithiasis Father     Nephrolithiasis Maternal Aunt     Urolithiasis Maternal Aunt     Prostate cancer Paternal  Uncle     Nephrolithiasis Maternal Grandfather     Urolithiasis Maternal Grandfather     Kidney Disease Maternal Grandfather     Prostate cancer Maternal Grandfather 70    Cancer Maternal Grandfather         Prostate Cancer    Diabetes Maternal Grandfather     Diabetes Maternal Grandmother     Cancer Paternal Grandmother         Uterine Cancer    Diabetes Paternal Uncle     Kidney cancer Neg Hx     Bladder Cancer Neg Hx        Social History     Socioeconomic History    Marital status: Single   Tobacco Use    Smoking status: Never    Smokeless tobacco: Never   Substance and Sexual Activity    Alcohol use: No    Drug use: No    Sexual activity: Never     Birth control/protection: Diaphragm   Social History Narrative    LIves at home with older sister whoh is well. Dad and mom       Outpatient Medications (Start of Encounter):  Current Outpatient Medications   Medication Sig    tamsulosin (FLOMAX) 0.4 mg capsule Take 1 capsule (0.4 mg total) by mouth every evening for Urinary Tract Stones.    potassium citrate (UROCIT-K) 10 mEq (1080 mg) CR tablet Take 2 tablets (20 mEq total) by mouth 2 times daily for Hypocitraturic Calcium Oxalate Kidney Stones.    chlorthalidone (HYGROTEN) 25 mg tablet Take 0.5 tablets (12.5 mg total) by mouth every morning    methylphenidate (RITALIN) 20 mg tablet Take 1 tablet (20 mg total) by mouth daily.    lisdexamfetamine (VYVANSE) 40 MG capsule Take 1 capsule (40 mg total) by mouth every morning for Attention Deficit Hyperactivity Disorder.    cetirizine (ZYRTEC) 10 MG tablet Take 1 tablet (10 mg total) by mouth daily.     No current facility-administered medications for this visit.       Allergies:  Allergies[1]      Physical Examination:  There were no vitals taken for this visit. ***    Weight:  There were no vitals filed for this visit.    There were no vitals filed for this visit.     General: {mhtexamgeneral:39613::"Appears generally well"} {mhtexamhd:39618}  HEENT:  {mhtexamHEENT:39614::"EOMI","Supple, trachea midline"}  CV: {mhtexamcard:39608::"S1, S2, Regular "}  Pulm: {mhtexampulm:39610::"CTA bilaterally, there is no wheezing, rales or rhonchi "}  Abd: {mhtexamabd:39607::"Abdomen is soft, non-tender, ","non-distended"}  Extremities: {mhtexamextremities:39609::"Normal muscle bulk and tone."}  Edema: {mhtexamedema:39615::"There is no LE edema "}  Skin: {mhtexamskin:39616::"Clean, dry, there is no rash on visible skin "}  Neuro: {mhtexamneuro:39617::"Awake, alert","NAD"}  Access: {MHTaccess:39604}    Lab Results:       Component Value Date/Time    NA 138 10/23/2019 1027    K 3.8 10/23/2019 1027    CL 99 10/23/2019 1027    CO2 28 10/23/2019 1027    UN 12 10/23/2019 1027    CREAT 0.80 10/23/2019 1027    GFRB CANCELED 10/23/2019 1027    GLU 133 (H) 10/23/2019 1027    CA 10.1 10/23/2019 1027    PO4 2.7 10/23/2019 1027    UCRR 29 11/04/2021 0845    MALBR <1.20 10/10/2021 1235          Component Value Date/Time    WBC 16.6 (H) 11/03/2017 1356    HGB 16.3 11/03/2017 1356    HCT 48 11/03/2017 1356    PLT 265 11/03/2017 1356        @LABRCNTIP (UNAR:2,UUNR:2,UTPR:2,UCRR:2,UKR:2,UCLR:2,UOSMO:2)@         Component Value Date/Time    VOLU 1,720 08/19/2018 1521    UCAC CANCELED 09/30/2018 0000    UUAR 24.9 08/17/2018 1200       No results for input(s): "CREAT" in the last 8760 hours.    No results for input(s): "TPCREATRATIO", "UCRR", "UTPR" in the last 72 hours.        Urine Protein Studies:     Total Protein (grams/day NML is < 0.150)  No results found for: "TPCREATRATIO"     Albumin (mg/day, NML is < 30)  Microalb/Creat Ratio   Date/Time Value Ref Range Status   10/10/2021 12:35 PM see below 0.0 - 29.9 mg MA/g CR Final     Comment:     Not calculated since one of the components of the calculation  is outside of the analyzer's measurable range.          Imaging Results:    Imaging:  US renal retroperitoneal complete 11/11/2022    Narrative  11/11/2022 10:26 AM    RENAL ULTRASOUND    CLINICAL  INFORMATION: Follow up kidney stone, N20.0-Calculus of kidney.    COMPARISON: Renal ultrasound 12/07/2019, CT abdomen and pelvis 04/04/2022.    TECHNIQUE:  Sonographic evaluation of the kidneys and bladder was performed using grayscale and color Doppler ultrasound.    FINDINGS:    Right Kidney:  10.1 cm in length.  Echogenicity: Normal.  Hydronephrosis: None.  Calculi: Yes  Right Stone 1: 0.4 cm Location: Lower pole.  Focal Lesions: None  Other: None.    Left Kidney: 10.7 cm in length.  Echogenicity: Normal.  Hydronephrosis: None.  Calculi: None  Focal Lesions: None    Other: None.      Aorta: Proximal, mid, and distal visualized. Visualized portions non-aneurysmal.  Proximal Iliacs: Visualized proximal portions non-aneurysmal.  IVC Superior:  Visualized upper portion unremarkable.    Bladder:   Bilateral ureteral jets visualized.  Impression  Nonobstructing right lower pole renal calculus.  No hydronephrosis bilaterally.      END OF IMPRESSION    I have personally reviewed the images and the Resident's/Fellow's interpretation and agree with or edited the findings.        [facility] submits this DICOM format image data and final report to the [facility], an independent secure electronic health information exchange, on a reciprocally searchable basis (with patient authorization) for a minimum of 12 months after exam  date.       No results found for this or any previous visit from the past 8760 hours.          Please see top of note for assessment and plan.      Electronically signed on 06/24/2023 10:03 AM           [1]   Allergies  Allergen Reactions    Ketorolac Hives and Shortness Of Breath    Environmental Allergies Other (See Comments)     Unknown      Penicillins Rash     Created by Conversion - 0;     No Known Latex Allergy      Created by Conversion - 0;     Meningococcal Group B Vaccine Other (See Comments)     ?

## 2023-07-08 ENCOUNTER — Ambulatory Visit: Payer: Commercial Managed Care - PPO | Admitting: Nephrology

## 2023-11-02 NOTE — Progress Notes (Deleted)
 [facility] Clinic Note:     I had the pleasure of seeing Ronald Wise, a 23 y.o. 2 following up for nephrolithiasis. he was seen for initial evaluation in Dec 2023 by myself.    Summary of Prior Renal Care:  2018 - first kidney stone  2018-2023 followed w Dr Zachary Casey, on CTD, UroCit K as well as general stone advice  Dec 2023 - established [facility] neph[facility] for nephrolithiasis. Was already on K Citrate and Chlorthalidone .  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 On chlorthalidone  and citrate.    History of Present Illness         Since his last visit, has been well.  No stone events.    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     Labs in June reviewed, available in [facility] - done in [place]     [place]  with urology last month showed small nonobstructing stone on the right - plan for observation.     Stone history (from [facility])  First stone event was age 41.  he has not had prior procedures for this. He does not have a urologist.     [place] at [place] R flank pain, hematuria. He believes he passed the stone.  He reports CT scan.     Current symptoms include - none.     Medications include Chlorthalidone , K Citrate     Stone analysis previously showed Ca. Oxalate.     24 hr urine ([facility]) - none     Stone Diet History:  Fluid intake: estimates his fluid intake at 2-3 L.  Sodium intake - he is on low sodium diet, but finds this challenging from time to time.  Protein intake: on moderated protein diet.  Oxalate intake:The patient has been counseled in the past re: high oxalate foods to avoid  Calcium intake:previously, he has been told to avoid calcium-containing foods.  Avoids dark sodas, sugary  drinks.     CT scan from 9th December (Care Everywhere)  KIDNEYS/URETERS: 3 mm stone is present at the right ureteropelvic junction.  This results in mild right hydronephrosis.   Small nonobstructing stone within the lower pole right kidney.  A few additional tiny nonobstructing punctate stones are suspected at the right kidney.   Otherwise unremarkable.      Assessment:    Ronald Wise is a 23 y.o. Male with nephrolithiasis, Ca Oxalate stones.      # Nephrolithiasis, Ca Oxalate   - prior CaOxalate stones. On Ca Citrate, Chlorthalidone                  - We will increase Uro Cit K 20 mEQ BID                 - Continue general kidney stone dietary advise (high fluid, low sodium, moderated protein. Watch oxalate. Advised normal Ca in diet.                  - Now following with [facility]                 - We will get 24 hr urine next after 3-4 months on increased uro cit K     #  Blood Pressure and volume status:  - Diagnosis is  White Coat Hypertension   - Note 24 hr BP high end of normal.    - On Chlorthalidone  for stone prevention.  - Discussed with him that this may mask hypOtension due to thiazide.          Return to clinic in *** with labs 1 week before next appointment.     Thank you very much for allowing me to share in the care of this patient. Do not hesitate to call with any questions or concerns.     Donnice VEAR [place], MD    ***Continued Care?    Supporting data:    Medical History:  Past Medical History:   Diagnosis Date    ADD (attention deficit disorder)     Environmental allergies 01/2018    Hypertension 2022    Seasonal allergies         No past surgical history on file.    Family History   Problem Relation Age of Onset    Breast cancer Mother 41    Cancer Mother         Breast Cancer    Nephrolithiasis Father     Urolithiasis Father     Nephrolithiasis Maternal Aunt     Urolithiasis Maternal Aunt     Prostate cancer Paternal Uncle     Nephrolithiasis Maternal Grandfather     Urolithiasis Maternal  Grandfather     Kidney Disease Maternal Grandfather     Prostate cancer Maternal Grandfather 68    Cancer Maternal Grandfather         Prostate Cancer    Diabetes Maternal Grandfather     Diabetes Maternal Grandmother     Cancer Paternal Grandmother         Uterine Cancer    Diabetes Paternal Uncle     Kidney cancer Neg Hx     Bladder Cancer Neg Hx        Social History     Socioeconomic History    Marital status: Single   Tobacco Use    Smoking status: Never    Smokeless tobacco: Never   Substance and Sexual Activity    Alcohol use: No    Drug use: No    Sexual activity: Never     Birth control/protection: Diaphragm   Social History Narrative    [place] at home with older sister whoh is well. Dad and mom       Outpatient Medications (Start of Encounter):  Current Outpatient Medications   Medication Sig    tamsulosin  (FLOMAX ) 0.4 mg capsule Take 1 capsule (0.4 mg total) by mouth every evening for Urinary Tract Stones.    potassium citrate  (UROCIT-K ) 10 mEq (1080 mg) CR tablet Take 2 tablets (20 mEq total) by mouth 2 times daily for Hypocitraturic Calcium Oxalate Kidney Stones.    chlorthalidone  (HYGROTEN) 25 mg tablet Take 0.5 tablets (12.5 mg total) by mouth every morning    methylphenidate (RITALIN) 20 mg tablet Take 1 tablet (20 mg total) by mouth daily.    lisdexamfetamine (VYVANSE) 40 MG capsule Take 1 capsule (40 mg total) by mouth every morning for Attention Deficit Hyperactivity Disorder.    cetirizine (ZYRTEC) 10 MG tablet Take 1 tablet (10 mg total) by mouth daily.     No current facility-administered medications for this visit.       Allergies: Allergies[1]      Physical Examination:  There were no vitals taken for this visit. ***  Weight:  There were no vitals filed for this visit.    There were no vitals filed for this visit.     General: {mhtexamgeneral:39613::Appears generally well} {mhtexamhd:39618}  HEENT: {mhtexamHEENT:39614::EOMI,Supple, trachea midline}  CV: {mhtexamcard:39608::S1, S2,  Regular }  Pulm: {mhtexampulm:39610::CTA bilaterally, there is no wheezing, rales or rhonchi }  Abd: {mhtexamabd:39607::Abdomen is soft, non-tender, ,non-distended}  Extremities: {mhtexamextremities:39609::Normal muscle bulk and tone.}  Edema: {mhtexamedema:39615::There is no LE edema }  Skin: {mhtexamskin:39616::Clean, dry, there is no rash on visible skin }  Neuro: {mhtexamneuro:39617::Awake, alert,NAD}  Access: {MHTaccess:39604}    Lab Results:       Component Value Date/Time    NA 138 10/23/2019 1027    K 3.8 10/23/2019 1027    CL 99 10/23/2019 1027    CO2 28 10/23/2019 1027    UN 12 10/23/2019 1027    CREAT 0.80 10/23/2019 1027    GFRB CANCELED 10/23/2019 1027    GLU 133 (H) 10/23/2019 1027    CA 10.1 10/23/2019 1027    PO4 2.7 10/23/2019 1027    UCRR 29 11/04/2021 0845    MALBR <1.20 10/10/2021 1235          Component Value Date/Time    WBC 16.6 (H) 11/03/2017 1356    HGB 16.3 11/03/2017 1356    HCT 48 11/03/2017 1356    PLT 265 11/03/2017 1356        @LABRCNTIP (UNAR:2,UUNR:2,UTPR:2,UCRR:2,UKR:2,UCLR:2,UOSMO:2)@         Component Value Date/Time    VOLU 1,720 08/19/2018 1521    UCAC CANCELED 09/30/2018 0000    UUAR 24.9 08/17/2018 1200       No results for input(s): CREAT in the last 8760 hours.    No results for input(s): TPCREATRATIO, UCRR, UTPR in the last 72 hours.        Urine Protein Studies:     Total Protein (grams/day NML is < 0.150)  No results found for: TPCREATRATIO     Albumin (mg/day, NML is < 30)  Microalb/Creat Ratio   Date/Time Value Ref Range Status   10/10/2021 12:35 PM see below 0.0 - 29.9 mg MA/g CR Final     Comment:     Not calculated since one of the components of the calculation  is outside of the analyzer's measurable range.          Imaging Results:    Imaging:  US  renal retroperitoneal complete 11/11/2022    Narrative  11/11/2022 10:26 AM    RENAL ULTRASOUND    CLINICAL INFORMATION: Follow up kidney stone, N20.0-Calculus of kidney.    COMPARISON: Renal  ultrasound 12/07/2019, CT abdomen and pelvis 04/04/2022.    TECHNIQUE:  Sonographic evaluation of the kidneys and bladder was performed using grayscale and color Doppler ultrasound.    FINDINGS:    Right Kidney:  10.1 cm in length.  Echogenicity: Normal.  Hydronephrosis: None.  Calculi: Yes  Right Stone 1: 0.4 cm Location: Lower pole.  Focal Lesions: None  Other: None.    Left Kidney: 10.7 cm in length.  Echogenicity: Normal.  Hydronephrosis: None.  Calculi: None  Focal Lesions: None    Other: None.      Aorta: Proximal, mid, and distal visualized. Visualized portions non-aneurysmal.  Proximal Iliacs: Visualized proximal portions non-aneurysmal.  IVC Superior:  Visualized upper portion unremarkable.    Bladder:   Bilateral ureteral jets visualized.    Impression  Nonobstructing right lower pole renal calculus.  No hydronephrosis bilaterally.      END OF  IMPRESSION    I have personally reviewed the images and the Resident's/Fellow's interpretation and agree with or edited the findings.        [facility] submits this DICOM format image data and final report to the [facility], an independent secure electronic health information exchange, on a reciprocally searchable basis (with patient authorization) for a minimum of 12 months after exam  date.       No results found for this or any previous visit from the past 8760 hours.          Please see top of note for assessment and plan.      Electronically signed on 11/02/2023 12:26 PM           [1]   Allergies  Allergen Reactions    Ketorolac  Hives and Shortness Of Breath    Environmental Allergies Other (See Comments)     Unknown      Penicillins Rash     Created by Conversion - 0;     No Known Latex Allergy      Created by Conversion - 0;     Meningococcal Group B Vaccine Other (See Comments)     ?

## 2023-11-03 ENCOUNTER — Telehealth: Payer: Self-pay

## 2023-11-03 NOTE — Telephone Encounter (Signed)
 Copied from CRM [id number]. Topic: Return Call - Speak to Provider/Office Staff  >> Nov 03, 2023 10:27 AM Lorra L wrote:  Patient is returning call from staff. Writer attempted to reach out to staff via Teams.     Patient was informed that staff will return call

## 2023-11-03 NOTE — Telephone Encounter (Signed)
 Writer called and left a voicemail to call back in regards to having a 24 hr urine test done after June 2024.

## 2023-11-03 NOTE — Telephone Encounter (Signed)
 Writer called and left a second voicemail to call back and advise if he had done another 24 hr urine test.

## 2023-11-04 ENCOUNTER — Ambulatory Visit: Payer: Commercial Managed Care - PPO | Admitting: Nephrology

## 2023-11-04 ENCOUNTER — Other Ambulatory Visit: Payer: Self-pay | Admitting: Nephrology

## 2023-11-05 ENCOUNTER — Other Ambulatory Visit: Payer: Self-pay | Admitting: Nephrology

## 2023-11-05 DIAGNOSIS — N200 Calculus of kidney: Secondary | ICD-10-CM

## 2024-01-07 ENCOUNTER — Other Ambulatory Visit: Payer: Self-pay | Admitting: Nephrology

## 2024-01-14 LAB — SUPER SATURATION,URINE - LITHOLINK(LABCORP)
Ammonium, Urine: 110 mmol/(24.h) — ABNORMAL HIGH (ref 15–60)
Calcium Oxalate Saturation: 8.51 (ref 6.00–10.00)
Calcium Phosphate Saturation: 0.83 (ref 0.50–2.00)
Calcium,Ur: 394 mg/(24.h) — ABNORMAL HIGH (ref <250)
Calcium/Creatinine Ratio: 88 mg/g{creat} (ref 34–196)
Calcium/Kg Body Weight: 4.5 mg/kg/d — ABNORMAL HIGH (ref <4.0)
Chloride, Urine: 345 mmol/(24.h) — ABNORMAL HIGH (ref 70–250)
Citrate, Urine: 315 mg/(24.h) — ABNORMAL LOW (ref >450)
Creatinine, Urine: 4478 mg/(24.h)
Creatinine/Kg Body Weight: 50.6 mg/kg/d — ABNORMAL HIGH (ref 11.9–24.4)
Magnesium, Urine: 291 mg/(24.h) — ABNORMAL HIGH (ref 30–120)
Oxalate, Urine: 71 mg/(24.h) — ABNORMAL HIGH (ref 20–40)
Phosphorus, Urine: 2495 mg/(24.h) — ABNORMAL HIGH (ref 600–1200)
Potassium, Urine: 79 mmol/(24.h) (ref 20–100)
Protein Catabolic Rate: 1.7 g/kg/d — ABNORMAL HIGH (ref 0.8–1.4)
Sodium, Urine: 313 mmol/(24.h) — ABNORMAL HIGH (ref 50–150)
Sulfate, Urine: 80 meq/(24.h) (ref 20–80)
Urea Nitrogen, Urine: 21.96 g/(24.h) — ABNORMAL HIGH (ref 6.00–14.00)
Uric Acid Saturation: 2.72 — ABNORMAL HIGH (ref <1.00)
Uric Acid, Urine: 1532 mg/(24.h) — ABNORMAL HIGH (ref <800)
Urine Volume (Preserved): 2470 mL/(24.h) (ref 500–4000)
pH, 24 hr, Urine: 5.519 — ABNORMAL LOW (ref 5.800–6.200)

## 8042-11-26 DEATH — deceased
